# Patient Record
Sex: Female | Born: 1970 | Race: White | Hispanic: No | Marital: Married | Smoking: Former smoker
Health system: Southern US, Community
[De-identification: ages and names within clinical notes are randomized; demographics above are authoritative.]

## PROBLEM LIST (undated history)

## (undated) DIAGNOSIS — G709 Myoneural disorder, unspecified: Secondary | ICD-10-CM

## (undated) DIAGNOSIS — E0500 Thyrotoxicosis with diffuse goiter without thyrotoxic crisis or storm: Secondary | ICD-10-CM

## (undated) DIAGNOSIS — E538 Deficiency of other specified B group vitamins: Secondary | ICD-10-CM

## (undated) DIAGNOSIS — I10 Essential (primary) hypertension: Secondary | ICD-10-CM

## (undated) DIAGNOSIS — G43009 Migraine without aura, not intractable, without status migrainosus: Secondary | ICD-10-CM

## (undated) DIAGNOSIS — Z923 Personal history of irradiation: Secondary | ICD-10-CM

## (undated) DIAGNOSIS — M359 Systemic involvement of connective tissue, unspecified: Secondary | ICD-10-CM

## (undated) DIAGNOSIS — M1990 Unspecified osteoarthritis, unspecified site: Secondary | ICD-10-CM

## (undated) DIAGNOSIS — T7840XA Allergy, unspecified, initial encounter: Secondary | ICD-10-CM

## (undated) DIAGNOSIS — K219 Gastro-esophageal reflux disease without esophagitis: Secondary | ICD-10-CM

## (undated) DIAGNOSIS — R209 Unspecified disturbances of skin sensation: Principal | ICD-10-CM

## (undated) HISTORY — DX: Migraine without aura, not intractable, without status migrainosus: G43.009

## (undated) HISTORY — DX: Gastro-esophageal reflux disease without esophagitis: K21.9

## (undated) HISTORY — DX: Systemic involvement of connective tissue, unspecified: M35.9

## (undated) HISTORY — DX: Unspecified osteoarthritis, unspecified site: M19.90

## (undated) HISTORY — DX: Thyrotoxicosis with diffuse goiter without thyrotoxic crisis or storm: E05.00

## (undated) HISTORY — DX: Allergy, unspecified, initial encounter: T78.40XA

## (undated) HISTORY — DX: Myoneural disorder, unspecified: G70.9

## (undated) HISTORY — DX: Deficiency of other specified B group vitamins: E53.8

## (undated) HISTORY — DX: Unspecified disturbances of skin sensation: R20.9

## (undated) HISTORY — DX: Personal history of irradiation: Z92.3

## (undated) HISTORY — DX: Essential (primary) hypertension: I10

---
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
## 1998-07-30 HISTORY — PX: TUBAL LIGATION: SHX77

## 2013-03-04 ENCOUNTER — Ambulatory Visit (INDEPENDENT_AMBULATORY_CARE_PROVIDER_SITE_OTHER): Payer: BC Managed Care – PPO

## 2013-03-04 ENCOUNTER — Ambulatory Visit (INDEPENDENT_AMBULATORY_CARE_PROVIDER_SITE_OTHER): Payer: BC Managed Care – PPO | Admitting: Neurology

## 2013-03-04 DIAGNOSIS — Z0289 Encounter for other administrative examinations: Secondary | ICD-10-CM

## 2013-03-04 DIAGNOSIS — R209 Unspecified disturbances of skin sensation: Secondary | ICD-10-CM

## 2013-03-04 NOTE — Procedures (Signed)
  HISTORY:  Sandy Brennan is a 42 year old patient with a six-week history of episodes of hot sensations across the lower abdomen, with sensations going into the legs as well with tingling in the feet. The episodes only occur while lying down, not present with up and active. The patient denies any problems with balance, weakness, or problems controlling the bowels or the bladder. The patient is being evaluated for a possible neuropathy or a lumbosacral radiculopathy.  NERVE CONDUCTION STUDIES:  Nerve conduction studies were performed on both lower extremities. The distal motor latencies and motor amplitudes for the peroneal and posterior tibial nerves were within normal limits. The nerve conduction velocities for these nerves were also normal. The H reflex latencies were normal. The sensory latencies for the peroneal nerves were within normal limits.   EMG STUDIES:  EMG study was performed on the right lower extremity:  The tibialis anterior muscle reveals 2 to 4K motor units with full recruitment. No fibrillations or positive waves were seen. The peroneus tertius muscle reveals 2 to 4K motor units with full recruitment. No fibrillations or positive waves were seen. The medial gastrocnemius muscle reveals 1 to 3K motor units with full recruitment. No fibrillations or positive waves were seen. The vastus lateralis muscle reveals 2 to 4K motor units with full recruitment. No fibrillations or positive waves were seen. The iliopsoas muscle reveals 2 to 4K motor units with full recruitment. No fibrillations or positive waves were seen. The biceps femoris muscle (long head) reveals 2 to 4K motor units with full recruitment. No fibrillations or positive waves were seen. The lumbosacral paraspinal muscles were tested at 3 levels, and revealed no abnormalities of insertional activity at the middle and lower levels tested. One plus fibrillations and positive waves were seen at the upper level. There was good  relaxation.  EMG study was performed on the left lower extremity:  The tibialis anterior muscle reveals 2 to 4K motor units with full recruitment. No fibrillations or positive waves were seen. The peroneus tertius muscle reveals 2 to 4K motor units with full recruitment. No fibrillations or positive waves were seen. The medial gastrocnemius muscle reveals 1 to 3K motor units with full recruitment. No fibrillations or positive waves were seen. The vastus lateralis muscle reveals 2 to 4K motor units with full recruitment. No fibrillations or positive waves were seen. The iliopsoas muscle reveals 2 to 4K motor units with full recruitment. No fibrillations or positive waves were seen. The biceps femoris muscle (long head) reveals 2 to 3K motor units with full recruitment. No fibrillations or positive waves were seen. The lumbosacral paraspinal muscles were tested at 3 levels, and revealed no abnormalities of insertional activity at all 3 levels tested. There was good relaxation.   IMPRESSION:  Nerve conduction studies done on both lower extremities were within normal limits. No evidence of a peripheral neuropathy is seen. A small fiber neuropathy may be missed by a standard nerve conduction studies, however. Clinical correlation is required. EMG study of the right lower extremity shows no abnormalities with exception of isolated mild acute denervation in the upper lumbosacral paraspinal muscles. This is of unclear clinical significance, but could represent an upper lumbosacral radiculopathy. Clinical correlation is required. EMG evaluation of the left lower extremity was normal. No evidence of a lumbosacral radiculopathy is seen.  Marlan Palau MD 03/04/2013 10:22 AM  [facility] [address] [place], [place] [address]  Phone [phone] Fax [phone]

## 2013-03-27 ENCOUNTER — Encounter: Payer: Self-pay | Admitting: Neurology

## 2013-03-27 ENCOUNTER — Ambulatory Visit (INDEPENDENT_AMBULATORY_CARE_PROVIDER_SITE_OTHER): Payer: BC Managed Care – PPO | Admitting: Neurology

## 2013-03-27 VITALS — BP 112/77 | HR 92 | Ht 65.0 in | Wt 145.0 lb

## 2013-03-27 DIAGNOSIS — R209 Unspecified disturbances of skin sensation: Secondary | ICD-10-CM

## 2013-03-27 HISTORY — DX: Unspecified disturbances of skin sensation: R20.9

## 2013-03-27 NOTE — Progress Notes (Signed)
Reason for visit: Sensory alteration  Sandy Brennan is a 42 y.o. female  History of present illness:  Sandy Brennan is a 42 year old right-handed white female with a history of onset of sensory alteration that began in the lower abdomen 2 months prior to this evaluation. The patient indicated that the sensory complaints began on 01/25/2013. The day before, she was completely normal. Within 2 weeks, the sensory alterations spread down both legs, and then began spreading up the back. The patient now has sensory alterations involving all 4 extremities. The patient has a burning or hot sensation that is internal and on the skin. The patient indicates that her symptoms virtually disappear when she stands up and walks, but comes on when she sits or lies down. The patient denies any problems controlling the bowels or the bladder. The patient has no weakness of the extremities or difficulty with balance. The patient was found to have a low vitamin B12 level, and she has gone on B12 shots and oral tablets. The patient will have flushing of the face at times, without significant diaphoresis. The patient will have an occasional electric shock sensations from the neck down into the left shoulder. The patient does have a history of occasional headaches on average occurring once a week. The patient is sent to this office for an evaluation. Nerve conduction studies done previously were unremarkable.  Past Medical History  Diagnosis Date  . Disturbance of skin sensation 03/27/2013  . H/O radioactive iodine thyroid ablation   . Vitamin B12 deficiency   . Migraine without aura, without mention of intractable migraine without mention of status migrainosus   . Graves disease     Currently hypothyroid    Past Surgical History  Procedure Laterality Date  . Cesarean section      2 surgeries  . Tonsillectomy    . Tubal ligation Bilateral     Family History  Problem Relation Age of Onset  . Colon cancer Father   .  Cancer Mother     Breast cancer    Social history:  reports that she has quit smoking. She has never used smokeless tobacco. She reports that she does not drink alcohol or use illicit drugs.  Medications:  No current outpatient prescriptions on file prior to visit.   No current facility-administered medications on file prior to visit.      Allergies  Allergen Reactions  . Codeine Swelling  . Erythromycin Swelling  . Penicillins Swelling  . Prednisone Swelling    ROS:  Out of a complete 14 system review of symptoms, the patient complains only of the following symptoms, and all other reviewed systems are negative.  Fatigue Chest pain, swelling in the legs Blurred vision, eye pain Easy bruising Feeling hot, flushing Skin sensitivity Memory loss, numbness, weakness, dizziness  Blood pressure 112/77, pulse 92, height 5\' 5"  (1.651 m), weight 145 lb (65.772 kg).  Physical Exam  General: The patient is alert and cooperative at the time of the examination.  Head: Pupils are equal, round, and reactive to light. Discs are flat bilaterally.  Neck: The neck is supple, no carotid bruits are noted.  Respiratory: The respiratory examination is clear.  Cardiovascular: The cardiovascular examination reveals a regular rate and rhythm, no obvious murmurs or rubs are noted.  Neuromuscular: The patient has excellent range of motion of cervical spine.  Skin: Extremities are without significant edema.  Neurologic Exam  Mental status:  Cranial nerves: Facial symmetry is present. There is good sensation  of the face to pinprick and soft touch bilaterally. The strength of the facial muscles and the muscles to head turning and shoulder shrug are normal bilaterally. Speech is well enunciated, no aphasia or dysarthria is noted. Extraocular movements are full. Visual fields are full.  Motor: The motor testing reveals 5 over 5 strength of all 4 extremities. Good symmetric motor tone is noted  throughout.  Sensory: Sensory testing is intact to pinprick, soft touch, vibration sensation, and position sense on all 4 extremities. No evidence of extinction is noted.  Coordination: Cerebellar testing reveals good finger-nose-finger and heel-to-shin bilaterally.  Gait and station: Gait is normal. Tandem gait is normal. Romberg is negative. No drift is seen.  Reflexes: Deep tendon reflexes are symmetric and normal bilaterally. Toes are downgoing bilaterally.   Assessment/Plan:  1. Sensory alteration, all 4 extremities  The patient will be set up for evaluation to exclude demyelinating disease affecting the spinal cord. The patient will be sent for MRI of the cervical and thoracic spine. If this is unremarkable, the patient will be sent for MRI of the brain. Further blood work may be indicated. The patient followup in 3-4 months.  Marlan Palau MD 03/27/2013 6:52 PM  [facility] [address] [place], [place] [address]  Phone [phone] Fax [phone]

## 2013-04-09 ENCOUNTER — Encounter: Payer: Self-pay | Admitting: Neurology

## 2013-04-09 ENCOUNTER — Ambulatory Visit (INDEPENDENT_AMBULATORY_CARE_PROVIDER_SITE_OTHER): Payer: BC Managed Care – PPO

## 2013-04-09 DIAGNOSIS — R209 Unspecified disturbances of skin sensation: Secondary | ICD-10-CM

## 2013-04-09 MED ORDER — GADOPENTETATE DIMEGLUMINE 469.01 MG/ML IV SOLN: 13.0000 mL | Freq: Once | INTRAVENOUS | Status: AC | PRN

## 2013-04-10 ENCOUNTER — Telehealth: Payer: Self-pay | Admitting: Neurology

## 2013-04-10 DIAGNOSIS — R209 Unspecified disturbances of skin sensation: Secondary | ICD-10-CM

## 2013-04-10 NOTE — Telephone Encounter (Signed)
I called patient. The MRI the cervical spine and thoracic spine were unremarkable, no evidence of compression or demyelinating disease. The patient indicates that the sensory changes have worsened. I will check further blood work. The patient has a history of a low B12 level, but she is on supplementation. I will get an MRI the brain.

## 2013-04-14 ENCOUNTER — Other Ambulatory Visit (INDEPENDENT_AMBULATORY_CARE_PROVIDER_SITE_OTHER): Payer: BC Managed Care – PPO

## 2013-04-14 DIAGNOSIS — R209 Unspecified disturbances of skin sensation: Secondary | ICD-10-CM

## 2013-04-14 DIAGNOSIS — Z0289 Encounter for other administrative examinations: Secondary | ICD-10-CM

## 2013-04-20 ENCOUNTER — Ambulatory Visit: Payer: BC Managed Care – PPO | Admitting: Neurology

## 2013-04-20 LAB — ANGIOTENSIN CONVERTING ENZYME: Angio Convert Enzyme: 20 U/L (ref 14–82)

## 2013-04-20 LAB — LYME, TOTAL AB TEST/REFLEX: Lyme IgG/IgM Ab: <0.91 {ISR} (ref 0.00–0.90)

## 2013-04-20 LAB — FACTOR 5 LEIDEN

## 2013-04-20 LAB — CARDIOLIPIN ANTIBODY
Anticardiolipin IgA: <9 APL U/mL (ref 0–11)
Anticardiolipin IgG: <9 GPL U/mL (ref 0–14)
Anticardiolipin IgM: <9 MPL U/mL (ref 0–12)

## 2013-04-20 LAB — LUPUS ANTICOAGULANT
PTT Lupus Anticoagulant: 38.4 s (ref 0.0–50.0)
dPT: 35.8 s (ref 0.0–55.0)

## 2013-04-20 LAB — SEDIMENTATION RATE: Sed Rate: 2 mm/hr (ref 0–32)

## 2013-04-21 ENCOUNTER — Telehealth: Payer: Self-pay

## 2013-04-21 NOTE — Telephone Encounter (Signed)
I called and spoke with patient. I relayed information from Dr. Anne Hahn regarding patient blood work results. Patient stated, "That's good," thanked me and had no questions.

## 2013-04-21 NOTE — Telephone Encounter (Signed)
Message copied by [facility] on Tue Apr 21, 2013  9:33 AM ------      Message from: Stephanie Acre      Created: Mon Apr 20, 2013  6:09 PM       Please call the patient. The blood work results are unremarkable. Thank you.            ----- Message -----         From: [facility] Lab Results In Interface         Sent: 04/20/2013  11:40 AM           To: York Spaniel, MD                   ------

## 2013-04-22 ENCOUNTER — Ambulatory Visit (INDEPENDENT_AMBULATORY_CARE_PROVIDER_SITE_OTHER): Payer: BC Managed Care – PPO

## 2013-04-22 DIAGNOSIS — R209 Unspecified disturbances of skin sensation: Secondary | ICD-10-CM

## 2013-04-22 MED ORDER — GADOPENTETATE DIMEGLUMINE 469.01 MG/ML IV SOLN: 13.0000 mL | Freq: Once | INTRAVENOUS | Status: AC | PRN

## 2013-04-23 ENCOUNTER — Telehealth: Payer: Self-pay | Admitting: Neurology

## 2013-04-23 DIAGNOSIS — R209 Unspecified disturbances of skin sensation: Secondary | ICD-10-CM

## 2013-04-23 NOTE — Telephone Encounter (Signed)
I called the patient. The patient is still having a lot of sensory symptoms. MRI evaluation the brain, cervical spine, and thoracic spine are all normal. Blood work was unremarkable. We will pursue further blood work, start the patient on Cymbalta. We will check nerve conduction studies, the patient still is having sensory changes on all 4 extremities.

## 2013-04-24 ENCOUNTER — Encounter: Payer: Self-pay | Admitting: Neurology

## 2013-04-27 ENCOUNTER — Other Ambulatory Visit (INDEPENDENT_AMBULATORY_CARE_PROVIDER_SITE_OTHER): Payer: BC Managed Care – PPO

## 2013-04-27 DIAGNOSIS — Z0289 Encounter for other administrative examinations: Secondary | ICD-10-CM

## 2013-04-27 DIAGNOSIS — R209 Unspecified disturbances of skin sensation: Secondary | ICD-10-CM

## 2013-04-29 NOTE — Progress Notes (Signed)
Quick Note:  Pt called with normal lab results. She asked about authorization for EMG. Will ask referral on tomorrow. ______

## 2013-05-06 ENCOUNTER — Telehealth: Payer: Self-pay | Admitting: Neurology

## 2013-05-06 ENCOUNTER — Ambulatory Visit (INDEPENDENT_AMBULATORY_CARE_PROVIDER_SITE_OTHER): Payer: BC Managed Care – PPO

## 2013-05-06 DIAGNOSIS — R209 Unspecified disturbances of skin sensation: Secondary | ICD-10-CM

## 2013-05-06 NOTE — Telephone Encounter (Signed)
I called the patient. The NCV were again normal. Etiology of her sensory complaints is not clear. Most of the sensory alteration is on the body, most notable when she is resting. MRI evaluation of the central nervous system is negative. I will check a 24 urine for heavy metals and for porphyria. We will consider a referral for second opinion at some point.

## 2013-05-06 NOTE — Procedures (Signed)
  HISTORY:  Sandy Brennan is a 41 year old patient with a history of sensory alteration on all 4 extremities. The patient has had symptoms for about 3 months, with a relatively sudden onset. The patient is being evaluated for a possible neuropathy.  NERVE CONDUCTION STUDIES:  Nerve conduction studies were performed on both upper extremities. The distal motor latencies and motor amplitudes for the median and ulnar nerves were within normal limits. The F wave latencies and nerve conduction velocities for these nerves were also normal. The sensory latencies for the median and ulnar nerves were normal.  Nerve conduction studies were performed on both lower extremities. The distal motor latencies and motor amplitudes for the peroneal and posterior tibial nerves were within normal limits. The nerve conduction velocities for these nerves were also normal. The H reflex latencies were normal. The sensory latencies for the peroneal nerves were within normal limits.   EMG STUDIES:  EMG evaluation was not performed.  IMPRESSION:  Nerve conduction studies done on all 4 extremities were unremarkable, without evidence of an underlying peripheral neuropathy.  Marlan Palau MD 05/06/2013 11:30 AM  [facility] [address] [place], [place] [address]  Phone [phone] Fax [phone]

## 2013-05-11 ENCOUNTER — Other Ambulatory Visit (INDEPENDENT_AMBULATORY_CARE_PROVIDER_SITE_OTHER): Payer: BC Managed Care – PPO

## 2013-05-11 ENCOUNTER — Other Ambulatory Visit: Payer: Self-pay | Admitting: Neurology

## 2013-05-11 ENCOUNTER — Encounter: Payer: Self-pay | Admitting: Neurology

## 2013-05-11 DIAGNOSIS — Z0289 Encounter for other administrative examinations: Secondary | ICD-10-CM

## 2013-05-12 ENCOUNTER — Telehealth: Payer: Self-pay | Admitting: Neurology

## 2013-05-12 NOTE — Telephone Encounter (Signed)
Patient left message on my chart if it was OK to bring her urine sample to the [facility] in [place] on [address].  I called the lab in [place] and they said that the patient could drop off the sample with them, they just needed the order.  I left a message for patient and told her that when she drops off the sample to have them call the office and we can fax the order.

## 2013-05-13 ENCOUNTER — Other Ambulatory Visit: Payer: Self-pay | Admitting: Neurology

## 2013-05-19 ENCOUNTER — Other Ambulatory Visit: Payer: Self-pay | Admitting: Neurology

## 2013-05-20 LAB — ALA DELTA, 24-HOUR URINE
Delta Ala, 24H Ur: 1.1 mg/L
Urine Volume: 2.2 mg/24 hr (ref 0.5–5.1)

## 2013-05-21 NOTE — Progress Notes (Signed)
Quick Note:  Unremarkable findings confirmed with patient, requesting a copy mailed ______

## 2013-06-01 ENCOUNTER — Encounter: Payer: Self-pay | Admitting: Neurology

## 2013-06-01 NOTE — Telephone Encounter (Signed)
Checked with Arlene, in [facility] and did get results.  Placed in Dr. Clarisa Kindred in box.

## 2013-06-04 ENCOUNTER — Other Ambulatory Visit: Payer: Self-pay

## 2013-06-18 ENCOUNTER — Encounter: Payer: Self-pay | Admitting: Neurology

## 2013-07-12 ENCOUNTER — Encounter: Payer: Self-pay | Admitting: Emergency Medicine

## 2013-07-12 ENCOUNTER — Emergency Department
Admission: EM | Admit: 2013-07-12 | Discharge: 2013-07-12 | Disposition: A | Discharge status: Home / Self Care | Payer: BC Managed Care – PPO | Source: Home / Self Care | Attending: Family Medicine | Admitting: Family Medicine

## 2013-07-12 ENCOUNTER — Emergency Department (INDEPENDENT_AMBULATORY_CARE_PROVIDER_SITE_OTHER): Payer: BC Managed Care – PPO

## 2013-07-12 DIAGNOSIS — R079 Chest pain, unspecified: Secondary | ICD-10-CM

## 2013-07-12 DIAGNOSIS — S20211A Contusion of right front wall of thorax, initial encounter: Secondary | ICD-10-CM

## 2013-07-12 DIAGNOSIS — M25569 Pain in unspecified knee: Secondary | ICD-10-CM

## 2013-07-12 DIAGNOSIS — S8002XA Contusion of left knee, initial encounter: Secondary | ICD-10-CM

## 2013-07-12 DIAGNOSIS — S20219A Contusion of unspecified front wall of thorax, initial encounter: Secondary | ICD-10-CM

## 2013-07-12 MED ORDER — MELOXICAM 15 MG PO TABS: 15.0000 mg | ORAL_TABLET | Freq: Every day | ORAL | Status: DC

## 2013-07-12 NOTE — ED Provider Notes (Signed)
CSN: [id number]     Arrival date & time 07/12/13  1632 History   First MD Initiated Contact with Patient 07/12/13 1654     Chief Complaint  Patient presents with  . Chest Injury    fall, with right rib pain  . Knee Pain    HPI  Pt presents with today with chief complaint of fall.  Pt accidentally fell at home striking R rib on cabinet and fell on L knee Has had R sided rib pain and L knee pain  No SOB L knee swelling Able to ambulate on L knee.     Past Medical History  Diagnosis Date  . Disturbance of skin sensation 03/27/2013  . H/O radioactive iodine thyroid ablation   . Vitamin B12 deficiency   . Migraine without aura, without mention of intractable migraine without mention of status migrainosus   . Graves disease     Currently hypothyroid   Past Surgical History  Procedure Laterality Date  . Cesarean section      2 surgeries  . Tonsillectomy    . Tubal ligation Bilateral    Family History  Problem Relation Age of Onset  . Colon cancer Father   . Cancer Mother     Breast cancer   History  Substance Use Topics  . Smoking status: Former Games developer  . Smokeless tobacco: Never Used  . Alcohol Use: No   OB History   Grav Para Term Preterm Abortions TAB SAB Ect Mult Living                 Review of Systems  All other systems reviewed and are negative.    Allergies  Codeine; Erythromycin; Penicillins; and Prednisone  Home Medications   Current Outpatient Rx  Name  Route  Sig  Dispense  Refill  . cyanocobalamin (,VITAMIN B-12,) 1000 MCG/ML injection   Intramuscular   Inject 1,000 mcg into the muscle every 30 (thirty) days.         . fluocinonide (LIDEX) 0.05 % external solution   Topical   Apply 0.05 application topically as needed.         . gabapentin (NEURONTIN) 100 MG capsule   Oral   Take 100 mg by mouth daily.         . Magnesium 250 MG TABS   Oral   Take 250 mg by mouth daily.         . meloxicam (MOBIC) 15 MG tablet   Oral  Take 1 tablet (15 mg total) by mouth daily.   30 tablet   1   . Multiple Vitamin (MULTIVITAMIN) tablet   Oral   Take 1 tablet by mouth daily.         . Potassium 99 MG TABS   Oral   Take 99 mg by mouth daily.         Marland Kitchen spironolactone (ALDACTONE) 25 MG tablet   Oral   Take 25 mg by mouth daily.         Marland Kitchen SYNTHROID 125 MCG tablet   Oral   Take 125 mcg by mouth daily.          BP 120/81  Pulse 81  Temp(Src) 97.7 F (36.5 C) (Oral)  Ht 5\' 6"  (1.676 m)  Wt 145 lb (65.772 kg)  BMI 23.41 kg/m2  SpO2 100%  LMP 07/06/2013 Physical Exam  Constitutional: She is oriented to person, place, and time. She appears well-developed and well-nourished.  HENT:  Head: Normocephalic and  atraumatic.  Eyes: Conjunctivae are normal. Pupils are equal, round, and reactive to light.  Neck: Normal range of motion.  Cardiovascular: Normal rate and regular rhythm.   Pulmonary/Chest: Effort normal and breath sounds normal.    Musculoskeletal: Normal range of motion.       Legs: Neurological: She is alert and oriented to person, place, and time.  Skin: Skin is warm.    ED Course  Procedures (including critical care time) Labs Review Labs Reviewed - No data to display Imaging Review Dg Ribs Unilateral W/chest Right  07/12/2013   CLINICAL DATA:  Fall, right anterior rib pain  EXAM: RIGHT RIBS AND CHEST - 3+ VIEW  COMPARISON:  None.  FINDINGS: Lungs are essentially clear. No focal consolidation. No pleural effusion or pneumothorax.  The heart is normal in size.  No displaced right rib fracture is seen.  IMPRESSION: No evidence of acute cardiopulmonary disease.  No displaced right rib fracture is seen.   Electronically Signed   By: Charline Bills M.D.   On: 07/12/2013 17:30   Dg Knee Complete 4 Views Left  07/12/2013   CLINICAL DATA:  Left knee pain, fell in bathtub 1 hr ago  EXAM: LEFT KNEE - COMPLETE 4+ VIEW  COMPARISON:  None  FINDINGS: Osseous mineralization normal.  Mild medial  compartment joint space narrowing.  Significant anterior soft tissue swelling at left knee and proximal left lower leg.  No acute fracture, dislocation or bone destruction.  No knee joint effusion.  IMPRESSION: No acute osseous abnormalities.  Minimal degenerative changes left knee.   Electronically Signed   By: Ulyses Southward M.D.   On: 07/12/2013 17:36    EKG Interpretation    Date/Time:    Ventricular Rate:    PR Interval:    QRS Duration:   QT Interval:    QTC Calculation:   R Axis:     Text Interpretation:              MDM   1. Rib contusion, right, initial encounter   2. Knee contusion, left, initial encounter    Xrays negative for any fracture or derangement.  Likley secondary soft tissue injury  RICE and NSAIDs.  L knee brace Plan for follow up with sports medicine for L knee.  Follow up as needed.     The patient and/or caregiver has been counseled thoroughly with regard to treatment plan and/or medications prescribed including dosage, schedule, interactions, rationale for use, and possible side effects and they verbalize understanding. Diagnoses and expected course of recovery discussed and will return if not improved as expected or if the condition worsens. Patient and/or caregiver verbalized understanding.         Doree Albee, MD 07/12/13 [phone]

## 2013-07-12 NOTE — ED Notes (Signed)
Larey Seat today noted with right rib pain and left knee pain;

## 2013-10-09 ENCOUNTER — Encounter: Payer: Self-pay | Admitting: Neurology

## 2013-10-16 ENCOUNTER — Ambulatory Visit: Payer: BC Managed Care – PPO | Admitting: Neurology

## 2014-01-11 ENCOUNTER — Other Ambulatory Visit (HOSPITAL_BASED_OUTPATIENT_CLINIC_OR_DEPARTMENT_OTHER): Payer: Self-pay | Admitting: Family Medicine

## 2014-01-11 DIAGNOSIS — R232 Flushing: Secondary | ICD-10-CM

## 2014-01-13 ENCOUNTER — Encounter (HOSPITAL_BASED_OUTPATIENT_CLINIC_OR_DEPARTMENT_OTHER): Payer: Self-pay

## 2014-01-13 ENCOUNTER — Ambulatory Visit (HOSPITAL_BASED_OUTPATIENT_CLINIC_OR_DEPARTMENT_OTHER)
Admission: RE | Admit: 2014-01-13 | Discharge: 2014-01-13 | Disposition: A | Discharge status: Home / Self Care | Payer: BC Managed Care – PPO | Source: Ambulatory Visit | Attending: Family Medicine | Admitting: Family Medicine

## 2014-01-13 DIAGNOSIS — I10 Essential (primary) hypertension: Secondary | ICD-10-CM | POA: Insufficient documentation

## 2014-01-13 DIAGNOSIS — R232 Flushing: Secondary | ICD-10-CM

## 2014-01-13 DIAGNOSIS — E0500 Thyrotoxicosis with diffuse goiter without thyrotoxic crisis or storm: Secondary | ICD-10-CM | POA: Insufficient documentation

## 2014-01-13 MED ORDER — IOHEXOL 300 MG/ML  SOLN
100.0000 mL | Freq: Once | INTRAMUSCULAR | Status: AC | PRN
  Administered 2014-01-13: 100 mL via INTRAVENOUS

## 2014-01-17 ENCOUNTER — Encounter: Payer: Self-pay | Admitting: Neurology

## 2014-01-28 LAB — PORPHYRINS, QN, 24 HR UR
Copropor(CP)III,24hr: 31 ug/24 hr (ref 0–74)
Coproporph(CP)I,24hr: 26 ug/24 hr — ABNORMAL HIGH (ref 0–24)
Coproporphyrin (CP) I: 10 ug/L
Coproporphyrin (CP) III: 12 ug/L
Heptacab(7-CP),24hr: 3 ug/24 hr (ref 0–4)
Heptacarboxyl (7-CP): 1 ug/L
Hexacarb(6-CP),24hr: <3 ug/24 hr — ABNORMAL HIGH (ref 0–1)
Hexacarboxyl (6-CP): <1 ug/L
Pentacarb(5-CP),24hr: <3 ug/24 hr (ref 0–4)
Pentacarboxyl (5-CP): <1 ug/L
Uroporph(UP),24hr: 23 ug/24 hr (ref 0–24)
Uroporphyrins (UP): 9 ug/L

## 2014-01-28 LAB — HEAVY METALS SCREEN, URINE
Arsenic Ur: NOT DETECTED ug/L (ref 0–50)
Arsenic(Inorganic),U: NOT DETECTED ug/L (ref 0–19)
Creatinine(Crt),U: 0.41 g/L (ref 0.30–3.00)
Lead, Rand Ur: NOT DETECTED ug/L (ref 0–49)
Mercury, Ur: NOT DETECTED ug/L (ref 0–19)

## 2014-07-24 IMAGING — CT CT ABD-PELV W/ CM
2 of 5 series · 16 of 46 positions shown, 18 images · IV contrast (APPLIED)
Comparison: Abdominal ultrasound, 06/10/2013

CLINICAL DATA: Hypertension. Flushing. Prior thyroid ablation and
Graves disease.

EXAM:
CT ABDOMEN AND PELVIS WITH CONTRAST
TECHNIQUE: Multidetector CT imaging of the abdomen and pelvis was performed
using the standard protocol following bolus administration of
intravenous contrast.
CONTRAST:  100mL OMNIPAQUE IOHEXOL 300 MG/ML  SOLN

[Series 2: abd/pelvis 5.0 b31f · axial · 0.60mm/px · z∈[-494,-124]mm · 13 of 84 slices shown, 15 images]
[im 5/84  soft-tissue]
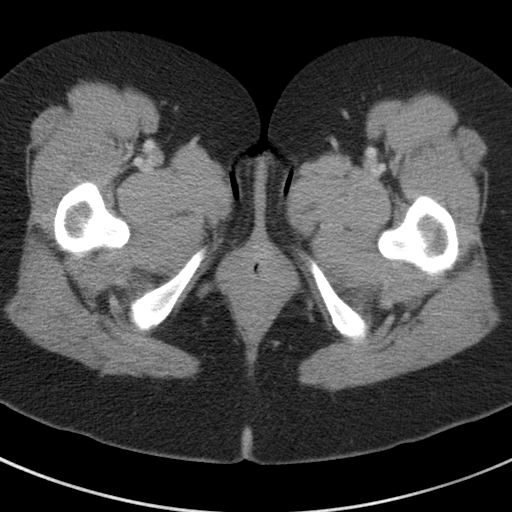
[im 5/84  bone]
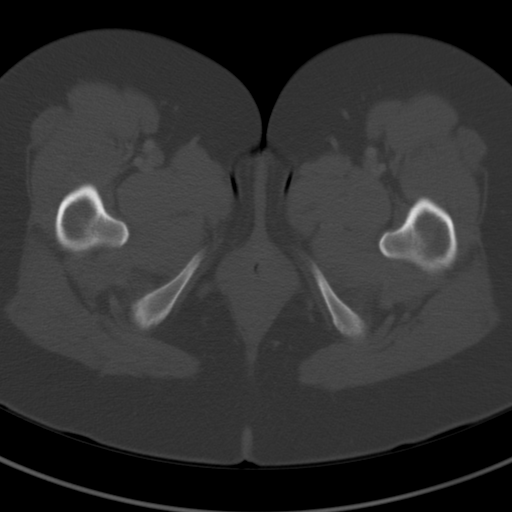
[im 14/84  soft-tissue]
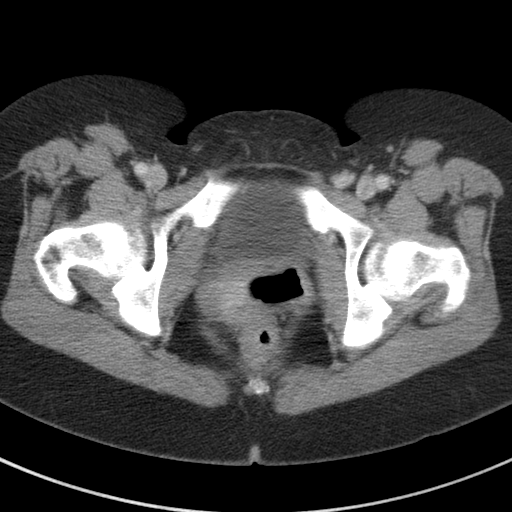
[im 18/84  soft-tissue]
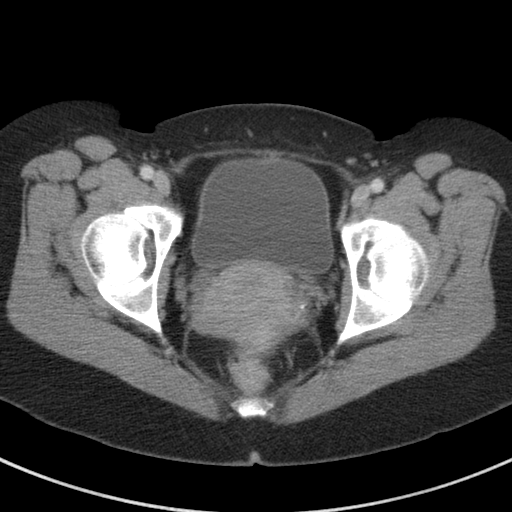
[im 22/84  soft-tissue]
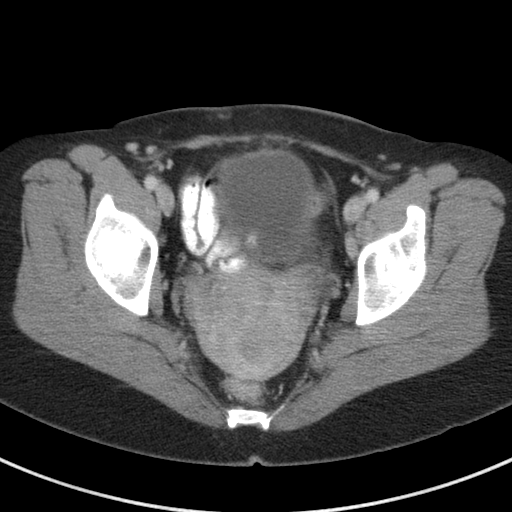
[im 31/84  soft-tissue]
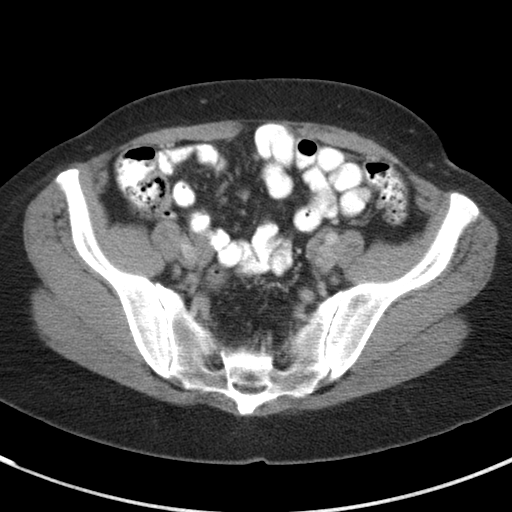
[im 35/84  soft-tissue]
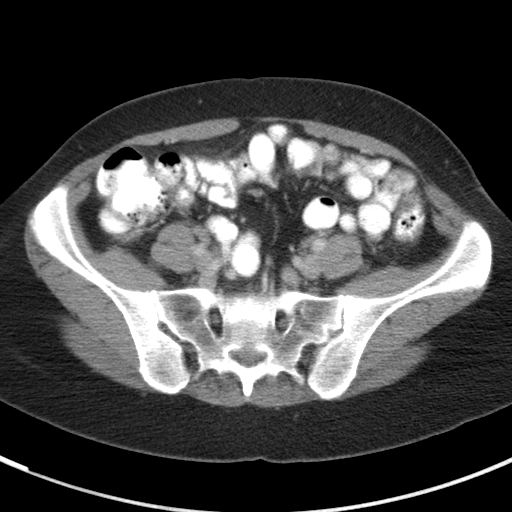
[im 44/84  soft-tissue]
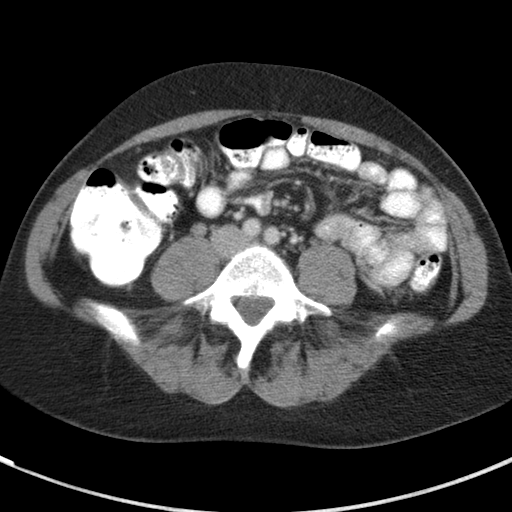
[im 49/84  soft-tissue]
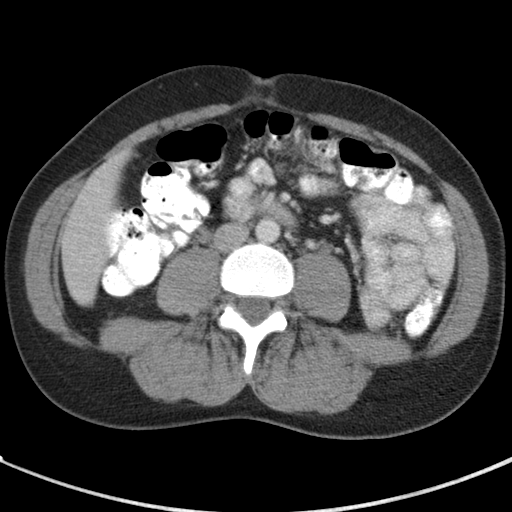
[im 53/84  soft-tissue]
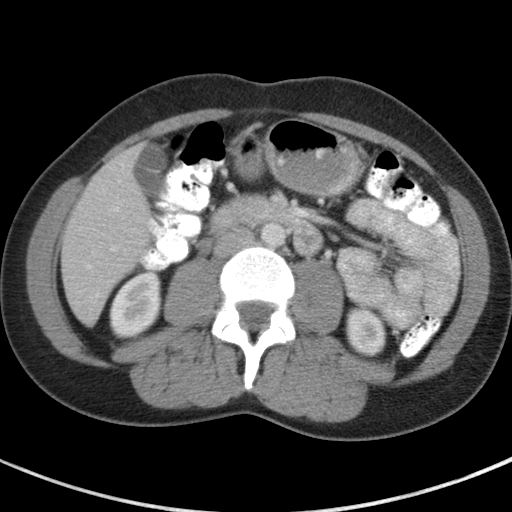
[im 53/84  bone]
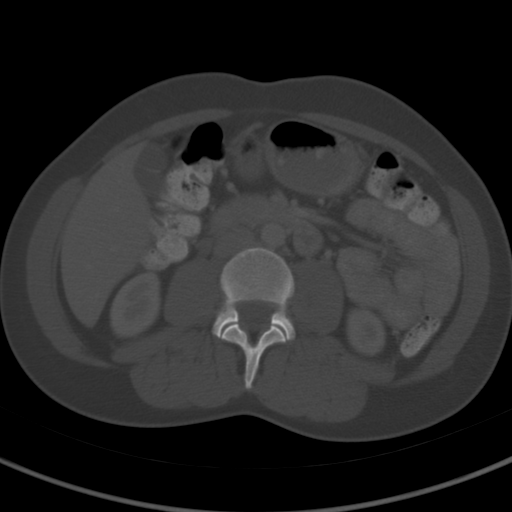
[im 62/84  soft-tissue]
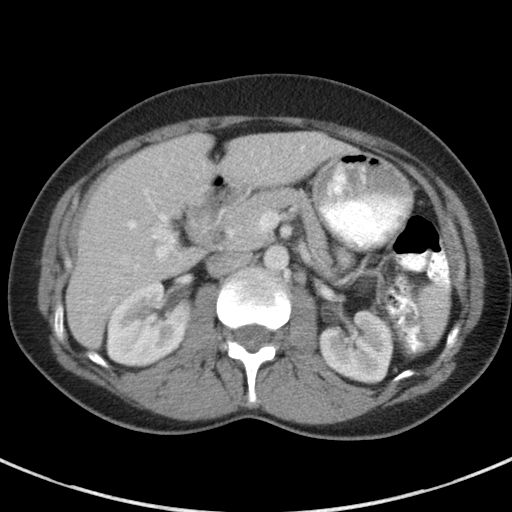
[im 66/84  soft-tissue]
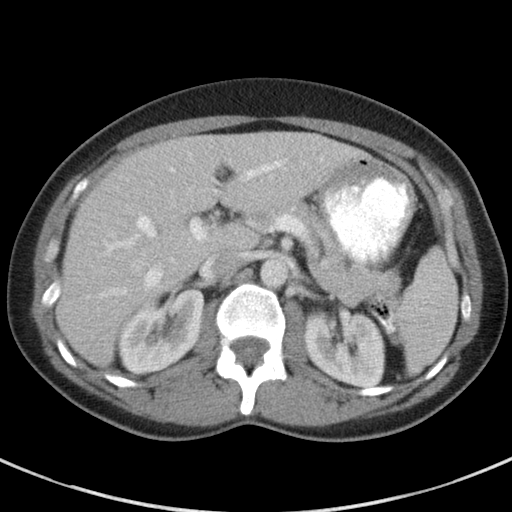
[im 70/84  soft-tissue]
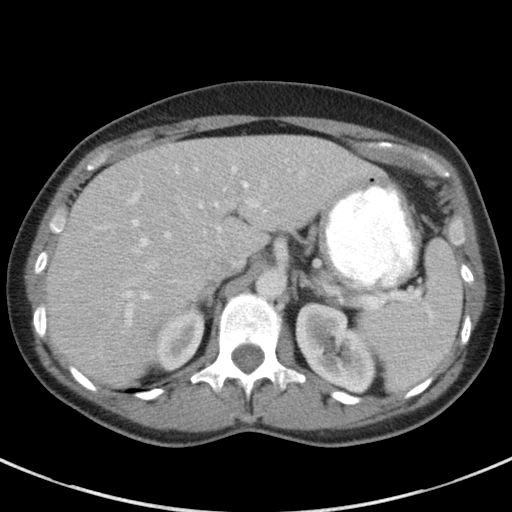
[im 79/84  soft-tissue]
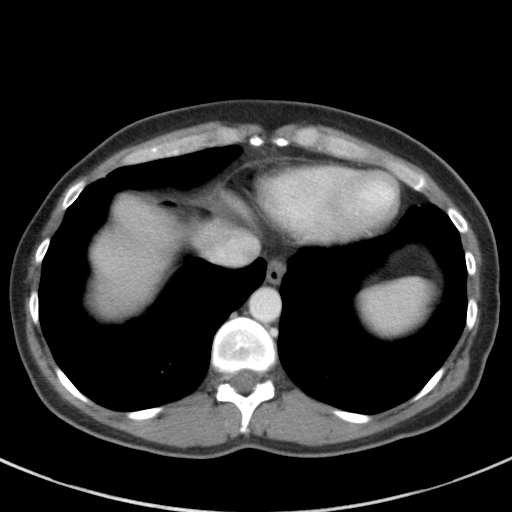

[Series 5: abd/pelvis 3.0 coronal · coronal · 0.63mm/px · 3 of 76 slices shown]
[im 26/76  soft-tissue]
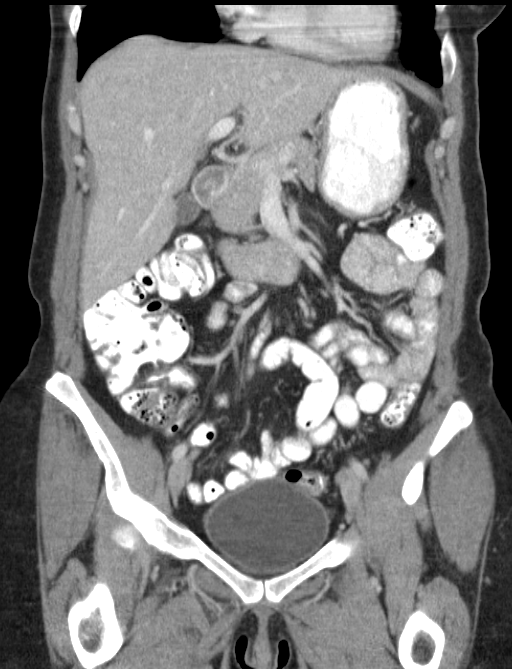
[im 34/76  soft-tissue]
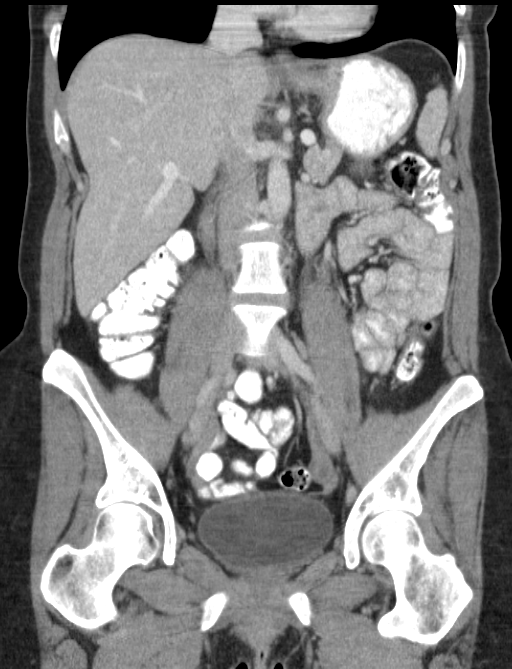
[im 42/76  soft-tissue]
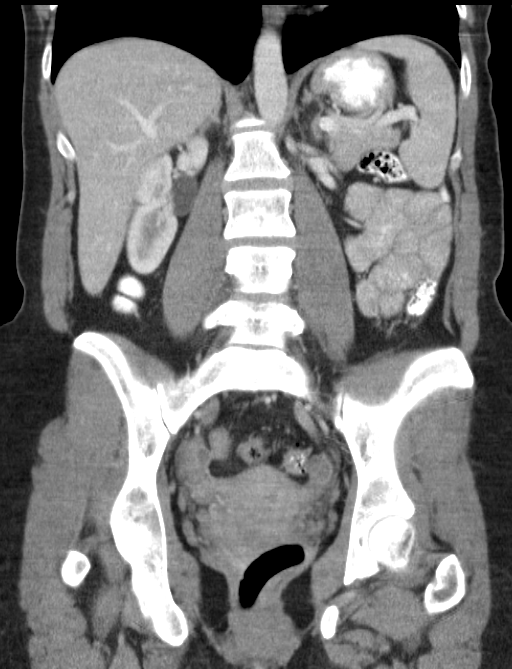

[16 of 46 positions shown; findings below may reference images not displayed]

FINDINGS: Scar or subsegmental atelectasis, right middle lobe. The liver,
spleen, pancreas, and gallbladder appear normal. No adrenal mass.
Normal morphology of the adrenal glands observed.

Suspected 1 mm left kidney upper pole nonobstructive calculus, image
16 of series 2. Kidneys otherwise unremarkable. Ureters and urinary
bladder appear unremarkable.

No pathologic retroperitoneal adenopathy. No retroperitoneal mass
observed. No pathologic pelvic adenopathy is observed.

Small umbilical hernia contains adipose tissue. Appendix normal.
Uterine and adnexal contours unremarkable. Incidentally, the uterus
appears retroverted.
IMPRESSION: 1. No adrenal mass or other mass in the abdomen/pelvis identified.
2. I suspect a 1 mm left kidney upper pole nonobstructive calculus.

## 2015-07-19 ENCOUNTER — Ambulatory Visit: Payer: Self-pay | Admitting: Interventional Cardiology

## 2015-08-03 ENCOUNTER — Other Ambulatory Visit: Payer: Self-pay | Admitting: Cardiology

## 2015-08-03 ENCOUNTER — Encounter: Payer: Self-pay | Admitting: Cardiology

## 2015-08-03 ENCOUNTER — Ambulatory Visit (INDEPENDENT_AMBULATORY_CARE_PROVIDER_SITE_OTHER): Payer: BLUE CROSS/BLUE SHIELD | Admitting: Cardiology

## 2015-08-03 VITALS — BP 120/80 | HR 86 | Ht 66.0 in | Wt 163.0 lb

## 2015-08-03 DIAGNOSIS — R079 Chest pain, unspecified: Secondary | ICD-10-CM | POA: Diagnosis not present

## 2015-08-03 DIAGNOSIS — R002 Palpitations: Secondary | ICD-10-CM | POA: Diagnosis not present

## 2015-08-03 DIAGNOSIS — R0789 Other chest pain: Secondary | ICD-10-CM

## 2015-08-03 DIAGNOSIS — R601 Generalized edema: Secondary | ICD-10-CM

## 2015-08-03 DIAGNOSIS — R609 Edema, unspecified: Secondary | ICD-10-CM | POA: Diagnosis not present

## 2015-08-03 DIAGNOSIS — R600 Localized edema: Secondary | ICD-10-CM

## 2015-08-03 DIAGNOSIS — R209 Unspecified disturbances of skin sensation: Secondary | ICD-10-CM

## 2015-08-03 NOTE — Progress Notes (Signed)
Cardiology Office Note   Date:  08/04/2015   ID:  Sandy Brennan, DOB 08-11-1970, MRN [id number]  PCP:  Martin[place], BETTY G, MD    Chief Complaint  Patient presents with  . Edema  . Palpitations      History of Present Illness: Sandy Brennan is a 45 y.o. female who presents for evaluation of a myriad of symptoms.  She has a history of hypothyroidism.  Her main complaint is a burning sensation that was initially in her feet and lower abdomen and then spreading into her entire body.  Initially it was only when she would sit down and would describe it as a boiing sensation in her abdomen as though she was cooking from the inside out.  This started back in 2014 and has had an extensive workup by her PCP all of which is negative.  She developed chemical sensitivities since moving to [place] and though it was her mattress.  SHe says that when she sleeps on her bed it discolors her bed sheets when she touches them and turns it black, SHe was referred to the Undiagnosed disease team at [facility] which did not accept her.  She denies any chest pain or pressure but complains of a burning hot sensation from her neck to her lower abdomen.  SHe denies any SOB, DOE, but does complain of LE edema and some palpitations.      Past Medical History  Diagnosis Date  . Disturbance of skin sensation 03/27/2013  . H/O radioactive iodine thyroid ablation   . Vitamin B12 deficiency   . Migraine without aura, without mention of intractable migraine without mention of status migrainosus   . Graves disease     Currently hypothyroid    Past Surgical History  Procedure Laterality Date  . Cesarean section      2 surgeries  . Tonsillectomy    . Tubal ligation Bilateral      Current Outpatient Prescriptions  Medication Sig Dispense Refill  . cyanocobalamin (,VITAMIN B-12,) 1000 MCG/ML injection Inject 1,000 mcg into the muscle every 30 (thirty) days.    . fluocinonide (LIDEX) 0.05 % external solution Apply  4.23 application topically as needed.    . Magnesium 250 MG TABS Take 250 mg by mouth daily.    . meloxicam (MOBIC) 15 MG tablet Take 1 tablet (15 mg total) by mouth daily. 30 tablet 1  . Multiple Vitamin (MULTIVITAMIN) tablet Take 1 tablet by mouth daily.    . Potassium 99 MG TABS Take 99 mg by mouth daily.    Marland Kitchen spironolactone (ALDACTONE) 25 MG tablet Take 25 mg by mouth daily.    Marland Kitchen SYNTHROID 125 MCG tablet Take 125 mcg by mouth daily.     No current facility-administered medications for this visit.    Allergies:   Codeine; Erythromycin; Penicillins; and Prednisone    Social History:  The patient  reports that she has quit smoking. She has never used smokeless tobacco. She reports that she does not drink alcohol or use illicit drugs.   Family History:  The patient's family history includes Cancer in her mother; Colon cancer in her father.    ROS:  Please see the history of present illness.   Otherwise, review of systems are positive for none.   All other systems are reviewed and negative.    PHYSICAL EXAM: VS:  BP 120/80 mmHg  Pulse 86  Ht 5'  6" (1.676 m)  Wt 163 lb (73.936 kg)  BMI 26.32 kg/m2 , BMI Body mass index is 26.32 kg/(m^2). GEN: Well nourished, well developed, in no acute distress HEENT: normal Neck: no JVD, carotid bruits, or masses Cardiac: RRR; no murmurs, rubs, or gallops,no edema  Respiratory:  clear to auscultation bilaterally, normal work of breathing GI: soft, nontender, nondistended, + BS MS: no deformity or atrophy Skin: warm and dry, no rash Neuro:  Strength and sensation are intact Psych: euthymic mood, full affect   EKG:  EKG is ordered today. The ekg ordered today demonstrates NSR with LAD   Recent Labs: No results found for requested labs within last 365 days.    Lipid Panel No results found for: CHOL, TRIG, HDL, CHOLHDL, VLDL, LDLCALC, LDLDIRECT    Wt Readings from Last 3 Encounters:  08/03/15 163 lb (73.936 kg)  07/12/13 145 lb  (65.772 kg)  03/27/13 145 lb (65.772 kg)       ASSESSMENT AND PLAN:  1.  LE edema - I do not see any on exam today 2.  Vague chest and abdomninal complaints of severe burning from her neck to her pelvis.  This does not sound cardiac in nature but her PCP mentioned to her that she may not be getting enough blood flow.  I will get an echo to make sure there is no structural heart disease  Her EKG is normal and symptoms are not consistent with coronary ischemia so will no pursue ETT at this time.  I will also get carotid dopplers at her request since she says her PCP mentioned that she might have a problem with her carotid arteries. 3.  Palpitations - these are vague - I will get a 30day heart monitor to assess  Current medicines are reviewed at length with the patient today.  The patient does not have concerns regarding medicines.  The following changes have been made:  no change  Labs/ tests ordered today: See above Assessment and Plan  Orders Placed This Encounter  Procedures  . Cardiac event monitor  . EKG 12-Lead  . ECHOCARDIOGRAM COMPLETE     Disposition:   FU with me PRN   Signed, Sueanne Margarita, MD  08/04/2015 7:08 AM    [facility] [address], [place], [place]  [address] Phone: [phone]; Fax: [phone]

## 2015-08-03 NOTE — Patient Instructions (Signed)
Medication Instructions:  Your physician recommends that you continue on your current medications as directed. Please refer to the Current Medication list given to you today.   Labwork: None  Testing/Procedures: Your physician has requested that you have an echocardiogram. Echocardiography is a painless test that uses sound waves to create images of your heart. It provides your doctor with information about the size and shape of your heart and how well your heart's chambers and valves are working. This procedure takes approximately one hour. There are no restrictions for this procedure.   Your physician has requested that you have a carotid duplex. This test is an ultrasound of the carotid arteries in your neck. It looks at blood flow through these arteries that supply the brain with blood. Allow one hour for this exam. There are no restrictions or special instructions.   Your physician has recommended that you wear an event monitor. Event monitors are medical devices that record the heart's electrical activity. Doctors most often us these monitors to diagnose arrhythmias. Arrhythmias are problems with the speed or rhythm of the heartbeat. The monitor is a small, portable device. You can wear one while you do your normal daily activities. This is usually used to diagnose what is causing palpitations/syncope (passing out).  Follow-Up: Your physician recommends that you schedule a follow-up appointment AS NEEDED with Dr. Mayford Knifeurner pending study results.  Any Other Special Instructions Will Be Listed Below (If Applicable).     If you need a refill on your cardiac medications before your next appointment, please call your pharmacy.

## 2015-08-04 DIAGNOSIS — R600 Localized edema: Secondary | ICD-10-CM | POA: Insufficient documentation

## 2015-08-04 DIAGNOSIS — R002 Palpitations: Secondary | ICD-10-CM | POA: Insufficient documentation

## 2015-08-04 DIAGNOSIS — R0789 Other chest pain: Secondary | ICD-10-CM | POA: Insufficient documentation

## 2015-08-04 DIAGNOSIS — R079 Chest pain, unspecified: Secondary | ICD-10-CM | POA: Insufficient documentation

## 2015-08-09 NOTE — Addendum Note (Signed)
Addended by: Armanda MagicURNER, Andrina Locken R on: 08/09/2015 10:02 PM   Modules accepted: Level of Service

## 2015-08-16 ENCOUNTER — Other Ambulatory Visit: Payer: Self-pay

## 2015-08-16 ENCOUNTER — Ambulatory Visit (HOSPITAL_COMMUNITY)
Admission: RE | Admit: 2015-08-16 | Discharge: 2015-08-16 | Disposition: A | Discharge status: Home / Self Care | Payer: BLUE CROSS/BLUE SHIELD | Source: Ambulatory Visit | Attending: Cardiology | Admitting: Cardiology

## 2015-08-16 ENCOUNTER — Ambulatory Visit (HOSPITAL_BASED_OUTPATIENT_CLINIC_OR_DEPARTMENT_OTHER): Payer: BLUE CROSS/BLUE SHIELD

## 2015-08-16 ENCOUNTER — Ambulatory Visit (INDEPENDENT_AMBULATORY_CARE_PROVIDER_SITE_OTHER): Payer: BLUE CROSS/BLUE SHIELD

## 2015-08-16 ENCOUNTER — Encounter (INDEPENDENT_AMBULATORY_CARE_PROVIDER_SITE_OTHER): Payer: Self-pay

## 2015-08-16 DIAGNOSIS — R209 Unspecified disturbances of skin sensation: Secondary | ICD-10-CM | POA: Insufficient documentation

## 2015-08-16 DIAGNOSIS — R079 Chest pain, unspecified: Secondary | ICD-10-CM | POA: Insufficient documentation

## 2015-08-16 DIAGNOSIS — R601 Generalized edema: Secondary | ICD-10-CM | POA: Diagnosis not present

## 2015-08-16 DIAGNOSIS — R002 Palpitations: Secondary | ICD-10-CM

## 2015-08-16 DIAGNOSIS — Z87891 Personal history of nicotine dependence: Secondary | ICD-10-CM | POA: Insufficient documentation

## 2016-04-18 ENCOUNTER — Ambulatory Visit (INDEPENDENT_AMBULATORY_CARE_PROVIDER_SITE_OTHER): Payer: BLUE CROSS/BLUE SHIELD | Admitting: Neurology

## 2016-04-18 ENCOUNTER — Telehealth: Payer: Self-pay | Admitting: Neurology

## 2016-04-18 ENCOUNTER — Encounter: Payer: Self-pay | Admitting: Neurology

## 2016-04-18 VITALS — BP 139/79 | HR 75 | Ht 66.0 in | Wt 140.8 lb

## 2016-04-18 DIAGNOSIS — R209 Unspecified disturbances of skin sensation: Secondary | ICD-10-CM | POA: Diagnosis not present

## 2016-04-18 MED ORDER — CARBAMAZEPINE 200 MG PO TABS: ORAL_TABLET | ORAL | 3 refills | Status: DC

## 2016-04-18 NOTE — Patient Instructions (Signed)
    Tegretol (carbamazepine) may result in dizziness, gait instability, cognitive slowing, or drowsiness. Sometimes, and allergic rash may occur, or a photosensitive rash may occur. If any significant side effects are noted, please contact our office.   

## 2016-04-18 NOTE — Progress Notes (Signed)
Reason for visit: Paresthesia  Referring physician: Dr. Sheran Fava is a 45 y.o. female  History of present illness:  Sandy Brennan is a 45 year old right-handed white female with a history of paresthesias that affected the entire body that began overnight on 01/25/2013. The patient has been seen through this office 3 years ago, she underwent MRI evaluation of the brain, cervical spine, and thoracic spine, she had EMG and nerve conduction study evaluation, and extensive blood work. The entire evaluation was unremarkable. The patient has been seen at [facility] through neurology, no diagnosis was offered at that time. She does have a history of vitamin B12 deficiency, she remains on supplementation. She indicates that her sensory symptoms have worsened over time. Currently, she has burning pain from the breast level down to the mid thigh level that is present with sitting and goes away when she stands up. She has burning sensations at night while sleeping. She has achy sensations in the legs, and electric shock sensations from the head level all the way down to the arms and legs. She has swelling of the feet at times, she may have hot sensations on one hand and cold sensations on the other. She may have blanching of one hand and redness of the other. She has dizziness, she feels somewhat forgetful. She has some issues with constipation, she may have urinary incontinence. She reports some mild gait instability, but no falls. She is not sleeping well at night, she may nap for 10 or 15 minutes at a time, and may get a total of only 2 hours of sleep a night. She has been tried on Lyrica, Cymbalta, and gabapentin without benefit. She currently is taking ibuprofen without much benefit. She is sent back to this office for an evaluation.  Past Medical History:  Diagnosis Date  . Disturbance of skin sensation 03/27/2013  . Graves disease    Currently hypothyroid  . H/O radioactive iodine thyroid ablation     . Migraine without aura, without mention of intractable migraine without mention of status migrainosus   . Vitamin B12 deficiency     Past Surgical History:  Procedure Laterality Date  . CESAREAN SECTION  1996, 2000   2 surgeries  . TONSILLECTOMY  1977  . TUBAL LIGATION Bilateral 2000    Family History  Problem Relation Age of Onset  . Colon cancer Father   . Cancer Mother     Breast cancer  . AAA (abdominal aortic aneurysm) Mother   . Diabetes Maternal Grandmother   . Ovarian cancer Maternal Grandmother   . Lung cancer Paternal Grandmother   . Lung cancer Paternal Grandfather     Social history:  reports that she quit smoking about 22 years ago. She has never used smokeless tobacco. She reports that she drinks about 4.2 oz of alcohol per week . She reports that she does not use drugs.  Medications:  Prior to Admission medications   Medication Sig Start Date End Date Taking? Authorizing Provider  cyanocobalamin (,VITAMIN B-12,) 1000 MCG/ML injection Inject 1,000 mcg into the muscle every 30 (thirty) days.   Yes Historical Provider, MD  fluocinonide (LIDEX) 0.05 % external solution Apply 0.05 application topically as needed. 01/15/13  Yes Historical Provider, MD  Magnesium 250 MG TABS Take 250 mg by mouth daily.   Yes Historical Provider, MD  meloxicam (MOBIC) 15 MG tablet Take 1 tablet (15 mg total) by mouth daily. 07/12/13  Yes Floydene Flock, MD  Multiple Vitamin (  MULTIVITAMIN) tablet Take 1 tablet by mouth daily.   Yes Historical Provider, MD  Potassium 99 MG TABS Take 99 mg by mouth daily.   Yes Historical Provider, MD  SYNTHROID 125 MCG tablet Take 125 mcg by mouth daily. 02/07/13  Yes Historical Provider, MD  triamterene-hydrochlorothiazide (DYAZIDE) 37.5-25 MG capsule  04/09/16  Yes Historical Provider, MD      Allergies  Allergen Reactions  . Codeine Swelling  . Erythromycin Swelling  . Penicillins Swelling  . Prednisone Swelling    ROS:  Out of a complete 14  system review of symptoms, the patient complains only of the following symptoms, and all other reviewed systems are negative.  Fatigue Swelling in the legs Spinning or rocking sensations Blurring of vision, eye pain Urinary incontinence Anemia, easy bruising Feeling hot, flushing Joint pain, joint swelling, aching muscles Allergies, skin sensitivity Memory loss, headache, numbness, weakness, slurred speech, difficulty swallowing, dizziness Anxiety, not enough sleep Insomnia, restless legs  Blood pressure 139/79, pulse 75, height 5\' 6"  (1.676 m), weight 140 lb 12 oz (63.8 kg).  Physical Exam  General: The patient is alert and cooperative at the time of the examination.  Eyes: Pupils are equal, round, and reactive to light. Discs are flat bilaterally.  Neck: The neck is supple, no carotid bruits are noted.  Respiratory: The respiratory examination is clear.  Cardiovascular: The cardiovascular examination reveals a regular rate and rhythm, no obvious murmurs or rubs are noted.  Skin: Extremities are without significant edema.  Neurologic Exam  Mental status: The patient is alert and oriented x 3 at the time of the examination. The patient has apparent normal recent and remote memory, with an apparently normal attention span and concentration ability.  Cranial nerves: Facial symmetry is present. There is good sensation of the face to pinprick and soft touch bilaterally. The strength of the facial muscles and the muscles to head turning and shoulder shrug are normal bilaterally. Speech is well enunciated, no aphasia or dysarthria is noted. Extraocular movements are full. Visual fields are full. The tongue is midline, and the patient has symmetric elevation of the soft palate. No obvious hearing deficits are noted.  Motor: The motor testing reveals 5 over 5 strength of all 4 extremities. Good symmetric motor tone is noted throughout.  Sensory: Sensory testing is intact to pinprick,  soft touch, vibration sensation, and position sense on all 4 extremities. No evidence of extinction is noted.  Coordination: Cerebellar testing reveals good finger-nose-finger and heel-to-shin bilaterally.  Gait and station: Gait is normal. Tandem gait is normal. Romberg is negative. No drift is seen.  Reflexes: Deep tendon reflexes are symmetric and normal bilaterally. Toes are downgoing bilaterally.   Assessment/Plan:  1. Subjective sensory alteration  The patient has a normal clinical examination. She reports unusual sensory alterations on her entire body, she has burning sensations of her midsection that completely disappear with standing, come on immediately with sitting. The etiology of this is not clear, she will be sent back for MRI of the brain and cervical spine, further blood work will be done. She will be given a trial on carbamazepine for the pain. She will follow-up in 4 months.   Marlan Palau. Keith Willis MD 04/18/2016 9:40 AM  [facility] [address] [place] [id number]  Phone [phone] Fax [phone]

## 2016-04-18 NOTE — Telephone Encounter (Signed)
Pt wants MRI here Before 04-29-16 if possible due to ins change

## 2016-04-23 LAB — GLIADIN ANTIBODIES, SERUM
Antigliadin Abs, IgA: 3 units (ref 0–19)
Gliadin IgG: 2 units (ref 0–19)

## 2016-04-23 LAB — COMPREHENSIVE METABOLIC PANEL
ALT: 13 IU/L (ref 0–32)
AST: 18 IU/L (ref 0–40)
Albumin/Globulin Ratio: 1.8 (ref 1.2–2.2)
Albumin: 4.7 g/dL (ref 3.5–5.5)
Alkaline Phosphatase: 69 IU/L (ref 39–117)
BUN/Creatinine Ratio: 15 (ref 9–23)
BUN: 12 mg/dL (ref 6–24)
Bilirubin Total: 0.9 mg/dL (ref 0.0–1.2)
CALCIUM: 9.5 mg/dL (ref 8.7–10.2)
CHLORIDE: 99 mmol/L (ref 96–106)
CO2: 27 mmol/L (ref 18–29)
Creatinine, Ser: 0.8 mg/dL (ref 0.57–1.00)
GFR, EST AFRICAN AMERICAN: 102 mL/min/{1.73_m2} (ref >59)
GFR, EST NON AFRICAN AMERICAN: 89 mL/min/{1.73_m2} (ref >59)
GLOBULIN, TOTAL: 2.6 g/dL (ref 1.5–4.5)
GLUCOSE: 110 mg/dL — AB (ref 65–99)
POTASSIUM: 4.5 mmol/L (ref 3.5–5.2)
Sodium: 140 mmol/L (ref 134–144)
TOTAL PROTEIN: 7.3 g/dL (ref 6.0–8.5)

## 2016-04-23 LAB — METHYLMALONIC ACID, SERUM: METHYLMALONIC ACID: 167 nmol/L (ref 0–378)

## 2016-04-23 LAB — PARANEOPLASTIC PROFILE 1
Neuronal Nuc Ab (Ri), IFA: <1:10 {titer}
Neuronal Nuclear (Hu) Antibody (IB): <1:10 {titer}
Purkinje Cell (Yo) Autoantobodies- IFA: <1:10 {titer}

## 2016-04-23 LAB — SEDIMENTATION RATE: Sed Rate: 2 mm/hr (ref 0–32)

## 2016-04-23 LAB — HTLV I+II ANTIBODIES, (EIA), BLD: HTLV I/II Ab: NEGATIVE

## 2016-04-23 LAB — VITAMIN B12: Vitamin B-12: 521 pg/mL (ref 211–946)

## 2016-04-25 ENCOUNTER — Ambulatory Visit (INDEPENDENT_AMBULATORY_CARE_PROVIDER_SITE_OTHER): Payer: BLUE CROSS/BLUE SHIELD

## 2016-04-25 DIAGNOSIS — R209 Unspecified disturbances of skin sensation: Secondary | ICD-10-CM | POA: Diagnosis not present

## 2016-04-26 MED ORDER — GADOPENTETATE DIMEGLUMINE 469.01 MG/ML IV SOLN: 13.0000 mL | Freq: Once | INTRAVENOUS | Status: AC | PRN

## 2016-04-27 ENCOUNTER — Telehealth: Payer: Self-pay | Admitting: Neurology

## 2016-04-27 MED ORDER — BACLOFEN 10 MG PO TABS: 5.0000 mg | ORAL_TABLET | Freq: Two times a day (BID) | ORAL | 1 refills | Status: DC

## 2016-04-27 NOTE — Telephone Encounter (Signed)
I called the patient. MRI the brain and cervical spine shows no central nervous system lesions, evidence of sinusitis on MRI the brain. The patient could not tolerate the carbamazepine secondary to nausea and headache. We will try baclofen, the patient has been on Topamax, Lyrica, Cymbalta, and gabapentin without benefit in the past.   MRI brain 04/26/16:  IMPRESSION:  This MRI of the brain with and without contrast shows the following: 1.   Maxillary, sphenoid and ethmoid chronic sinusitis. 2.   Brain parenchyma appears normal before and after contrast administration. 3.   There are no acute findings.   MRI cervical 04/26/16:   IMPRESSION:  This MRI of the cervical spine with and without contrast shows the following: 1.    The spinal cord appears normal. 2.    Small disc bulges at C5-C6 and T1-T2 that do not lead to any nerve root impingement. 3.    There is a normal enhancement pattern.

## 2016-05-04 ENCOUNTER — Encounter: Payer: Self-pay | Admitting: Neurology

## 2016-05-07 ENCOUNTER — Other Ambulatory Visit: Payer: Self-pay | Admitting: Neurology

## 2016-05-07 DIAGNOSIS — G894 Chronic pain syndrome: Secondary | ICD-10-CM

## 2016-08-14 ENCOUNTER — Encounter: Payer: Self-pay | Admitting: Neurology

## 2016-08-21 ENCOUNTER — Ambulatory Visit: Payer: BLUE CROSS/BLUE SHIELD | Admitting: Neurology

## 2017-04-18 ENCOUNTER — Encounter: Payer: Self-pay | Admitting: Family Medicine

## 2018-02-12 ENCOUNTER — Telehealth: Payer: Self-pay | Admitting: Neurology

## 2018-02-12 NOTE — Telephone Encounter (Signed)
[facility] [phone]  [phone]

## 2021-10-28 DIAGNOSIS — R200 Anesthesia of skin: Secondary | ICD-10-CM | POA: Insufficient documentation

## 2022-01-11 ENCOUNTER — Ambulatory Visit: Payer: BLUE CROSS/BLUE SHIELD | Admitting: Cardiology

## 2022-03-21 NOTE — Progress Notes (Unsigned)
HPI: Sandy Brennan is a 51 y.o. female, who is here today to re-establish care.  Former PCP: Dr Arnetha Gula. Last preventive routine visit: 06/06/21  Chronic medical problems: Demyelinating disease , insomnia,IFG,abdominal pain/bloating sensation,,hypothyroidism,chronic pain (myalgias and arthralgias), HLD,IBS,GERD  Insomnia: She has tried different medication and they have not help or did not tolerate well. Lunesta,Amben,Valium,melatonin,Mg,,CBD,and Trazodone, the latter one caused night mares. Sleeps 1-3 hours.  Hypothyroidism: S/P iodine radiation treatment in 2010. She is on Levothyroxine 112 mcg daily.   Concerns today: right lateral thigh pain and inner numbness sensation since 10/2021. Pain improves with movement and exacerbated at rest, worse late afternoon, around 6 pm. Pain interferes with sleep.  She reports having EMG and thoracic/cervical MRI. Follows with neurologist.  She is concerned about possible bone infection,would like X ray done. Right groin tenderness and edema. She has had US done and according to pt, did not show cause of pain. It is stable.  Generalized pain, myalgias and arthralgias, getting worse. Now she has pain when lying on cough or in the bathtub, like she lying on concrete. Excruciated bone pain. She has tried Lyrica,Gabapentin, and Duloxetine. Her neurologist recommended Nortriptyline 10 mg, started yesterday. Positive ANA's.  Follows with rheumatologist. She has been on Plaquenil but cause leg crams and night sweats.  Still having night sweats, wonders if this is caused by menopause, she would like hormones check. LMP 11/2021.  Recently evaluated by dermatologist and underwent skin bx for erythematous rash,edema, and feet bullae, improving. Bx result is still pending. She has an appt with wound clinic.  She takes Triamterene-HCTZ 75-50 mg daily for edema.  Review of Systems  Constitutional:  Positive for activity change and fatigue.  Negative for appetite change and fever.  HENT:  Negative for mouth sores, nosebleeds and trouble swallowing.   Eyes:  Negative for redness and visual disturbance.  Respiratory:  Negative for cough, shortness of breath and wheezing.   Cardiovascular:  Negative for chest pain and palpitations.  Gastrointestinal:  Positive for abdominal pain (Chronic.) and nausea (sometimes.). Negative for vomiting.       Negative for changes in bowel habits.  Endocrine: Negative for cold intolerance, heat intolerance, polydipsia, polyphagia and polyuria.  Genitourinary:  Negative for decreased urine volume, dysuria and hematuria.  Musculoskeletal:  Positive for arthralgias, back pain and myalgias. Negative for gait problem.  Neurological:  Positive for numbness. Negative for tremors, syncope, weakness and headaches.  Rest see pertinent positives and negatives per HPI.  Current Outpatient Medications on File Prior to Visit  Medication Sig Dispense Refill   esomeprazole (NEXIUM) 20 MG capsule Take 20 mg by mouth 2 (two) times daily before a meal.     Eszopiclone 3 MG TABS Take 3 mg by mouth at bedtime.     fluticasone (FLONASE) 50 MCG/ACT nasal spray Place 1 spray into both nostrils daily.     hydroxychloroquine (PLAQUENIL) 200 MG tablet Take 200 mg by mouth 2 (two) times daily.     levothyroxine (SYNTHROID) 112 MCG tablet Take 112 mcg by mouth daily.     lubiprostone (AMITIZA) 24 MCG capsule Take 1 capsule by mouth in the morning and at bedtime.     meloxicam (MOBIC) 15 MG tablet Take 1 tablet (15 mg total) by mouth daily. 30 tablet 1   Naltrexone HCl, Pain, 1.5 MG CAPS Take 2 capsules by mouth daily.     nortriptyline (PAMELOR) 10 MG capsule Take 10 mg by mouth at bedtime.     ondansetron (ZOFRAN) 4  MG tablet Take 4 mg by mouth every 8 (eight) hours as needed for nausea or vomiting.     Potassium 99 MG TABS Take 99 mg by mouth daily.     pravastatin (PRAVACHOL) 20 MG tablet Take 20 mg by mouth daily.      triamterene-hydrochlorothiazide (MAXZIDE) 75-50 MG tablet Take 1 tablet by mouth daily.     Current Facility-Administered Medications on File Prior to Visit  Medication Dose Route Frequency Provider Last Rate Last Admin   gadopentetate dimeglumine (MAGNEVIST) injection 13 mL  13 mL Intravenous Once PRN York Spaniel, MD       Past Medical History:  Diagnosis Date   Allergy 1973   Arthritis 2015   Disturbance of skin sensation 03/27/2013   GERD (gastroesophageal reflux disease) 2023   Graves disease    Currently hypothyroid   H/O radioactive iodine thyroid ablation    Hypertension 2005   Migraine without aura, without mention of intractable migraine without mention of status migrainosus    Neuromuscular disorder (HCC) 2014   Vitamin B12 deficiency    Allergies  Allergen Reactions   Codeine Swelling   Erythromycin Swelling   Penicillins Swelling   Prednisone Swelling    Family History  Problem Relation Age of Onset   Colon cancer Father    Cancer Father    Early death Father    Cancer Mother        Breast cancer   AAA (abdominal aortic aneurysm) Mother    Arthritis Mother    Diabetes Maternal Grandmother    Ovarian cancer Maternal Grandmother    Lung cancer Paternal Grandmother    Lung cancer Paternal Grandfather    Intellectual disability Son     Social History   Socioeconomic History   Marital status: Married    Spouse name: Not on file   Number of children: 2   Years of education: 2 yrs college   Highest education level: Not on file  Occupational History   Occupation: Self-employed  Tobacco Use   Smoking status: Former    Types: Cigarettes    Quit date: 04/18/1994    Years since quitting: 27.9   Smokeless tobacco: Never  Substance and Sexual Activity   Alcohol use: Yes    Alcohol/week: 5.0 standard drinks of alcohol    Types: 5 Glasses of wine per week    Comment: 1 glass per day   Drug use: No   Sexual activity: Yes    Partners: Male    Birth  control/protection: Surgical    Comment: Married  Other Topics Concern   Not on file  Social History Narrative   Lives at home w/ her husband and 2 children   Right-handed   Caffeine: 2 cups per day   Social Determinants of Health   Financial Resource Strain: Not on file  Food Insecurity: Not on file  Transportation Needs: Not on file  Physical Activity: Not on file  Stress: Not on file  Social Connections: Not on file   Vitals:   03/23/22 0814  BP: 128/80  Pulse: 79  Resp: 12  Temp: 98 F (36.7 C)  SpO2: 97%   Body mass index is 24.53 kg/m.  Physical Exam Vitals and nursing note reviewed.  Constitutional:      General: She is not in acute distress.    Appearance: She is well-developed.  HENT:     Head: Normocephalic and atraumatic.     Mouth/Throat:     Mouth: Mucous  membranes are moist.     Pharynx: Oropharynx is clear.  Eyes:     Conjunctiva/sclera: Conjunctivae normal.  Cardiovascular:     Rate and Rhythm: Normal rate and regular rhythm.     Pulses:          Dorsalis pedis pulses are 2+ on the right side and 2+ on the left side.     Heart sounds: No murmur heard. Pulmonary:     Effort: Pulmonary effort is normal. No respiratory distress.     Breath sounds: Normal breath sounds.  Abdominal:     Palpations: Abdomen is soft. There is no mass.     Tenderness: There is no abdominal tenderness.  Musculoskeletal:     Right lower leg: No tenderness or bony tenderness. 1+ Pitting Edema present.     Left lower leg: 1+ Pitting Edema present.  Lymphadenopathy:     Cervical: No cervical adenopathy.     Comments: Tenderness upon palpation of right inguinal area, no erythema or clear mass.  Skin:    General: Skin is warm.     Comments: Scattered superficial excoriations on dorsum feet, mild macular erythema scatted on LE's.  Neurological:     General: No focal deficit present.     Mental Status: She is alert and oriented to person, place, and time.     Cranial  Nerves: No cranial nerve deficit.     Gait: Gait normal.  Psychiatric:        Mood and Affect: Mood is anxious.     Comments: Well groomed, good eye contact.   ASSESSMENT AND PLAN:  Sandy Brennan was seen today for establish care.  Diagnoses and all orders for this visit: Orders Placed This Encounter  Procedures   DG FEMUR, MIN 2 VIEWS RIGHT   Heavy Metals Profile, Urine   Follicle stimulating hormone   T3, free   T4, free   TSH   CBC with Differential/Platelet   Lab Results  Component Value Date   TSH 0.48 03/23/2022   Lab Results  Component Value Date   WBC 6.5 03/23/2022   HGB 12.8 03/23/2022   HCT 37.3 03/23/2022   MCV 95.3 03/23/2022   PLT 223.0 03/23/2022   Pain of right thigh Chronic. ? Radicular,fibromyalgia,neuropathy among some to consider. Further recommendations according to femur X ray. Continue Nortriptyline 10 mg daily.  Amenorrhea We discussed Dx criteria for menopause, explained that hormonal testing is not recommended. Perimenopause most likely. She follows with gyn.  Polyneuropathy, unspecified Chronic. Apparently she has had extensive work up including lumbar puncture. Following with neurologist. Continue Nortriptyline 10 mg daily. She tried Lyrica, she is willing to try again, 50 mg at bedtime.  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 Continue Levothyroxine 112 mcg daily. Further recommendations according to TSH result.  Bullous pemphigus Improving. Pending appt with wound clinic. Continue following with dermatologist.  Insomnia, unspecified type Nortriptyline 10 mg started last night. She has failed other treatments. Good sleep hygiene. Lyrica may help.  I spent a total of 47 minutes in both face to face and non face to face activities for this visit on the date of this encounter. During this time history was obtained and documented, examination was performed, prior labs reviewed, and assessment/plan  discussed.  Return in about 4 weeks (around 04/20/2022).  Selen Smucker G. [place], MD  [facility]. [place] office.

## 2022-03-23 ENCOUNTER — Ambulatory Visit (INDEPENDENT_AMBULATORY_CARE_PROVIDER_SITE_OTHER): Payer: PRIVATE HEALTH INSURANCE

## 2022-03-23 ENCOUNTER — Ambulatory Visit (INDEPENDENT_AMBULATORY_CARE_PROVIDER_SITE_OTHER): Payer: PRIVATE HEALTH INSURANCE | Admitting: Family Medicine

## 2022-03-23 ENCOUNTER — Encounter: Payer: Self-pay | Admitting: Family Medicine

## 2022-03-23 VITALS — BP 128/80 | HR 79 | Temp 98.0°F | Resp 12 | Ht 66.0 in | Wt 152.0 lb

## 2022-03-23 DIAGNOSIS — G629 Polyneuropathy, unspecified: Secondary | ICD-10-CM | POA: Diagnosis not present

## 2022-03-23 DIAGNOSIS — L109 Pemphigus, unspecified: Secondary | ICD-10-CM

## 2022-03-23 DIAGNOSIS — G4700 Insomnia, unspecified: Secondary | ICD-10-CM

## 2022-03-23 DIAGNOSIS — E890 Postprocedural hypothyroidism: Secondary | ICD-10-CM

## 2022-03-23 DIAGNOSIS — M79651 Pain in right thigh: Secondary | ICD-10-CM

## 2022-03-23 DIAGNOSIS — N912 Amenorrhea, unspecified: Secondary | ICD-10-CM | POA: Diagnosis not present

## 2022-03-23 LAB — CBC WITH DIFFERENTIAL/PLATELET
Basophils Absolute: 0.1 10*3/uL (ref 0.0–0.1)
Basophils Relative: 1.2 % (ref 0.0–3.0)
Eosinophils Absolute: 0.8 10*3/uL — ABNORMAL HIGH (ref 0.0–0.7)
Eosinophils Relative: 12.7 % — ABNORMAL HIGH (ref 0.0–5.0)
HCT: 37.3 % (ref 36.0–46.0)
Hemoglobin: 12.8 g/dL (ref 12.0–15.0)
Lymphocytes Relative: 35.4 % (ref 12.0–46.0)
Lymphs Abs: 2.3 10*3/uL (ref 0.7–4.0)
MCHC: 34.3 g/dL (ref 30.0–36.0)
MCV: 95.3 fl (ref 78.0–100.0)
Monocytes Absolute: 0.3 10*3/uL (ref 0.1–1.0)
Monocytes Relative: 5.3 % (ref 3.0–12.0)
Neutro Abs: 2.9 10*3/uL (ref 1.4–7.7)
Neutrophils Relative %: 45.4 % (ref 43.0–77.0)
Platelets: 223 10*3/uL (ref 150.0–400.0)
RBC: 3.92 Mil/uL (ref 3.87–5.11)
RDW: 12.9 % (ref 11.5–15.5)
WBC: 6.5 10*3/uL (ref 4.0–10.5)

## 2022-03-23 LAB — TSH: TSH: 0.48 u[IU]/mL (ref 0.35–5.50)

## 2022-03-23 LAB — FOLLICLE STIMULATING HORMONE: FSH: 46.5 m[IU]/mL

## 2022-03-23 LAB — T4, FREE: Free T4: 1.21 ng/dL (ref 0.60–1.60)

## 2022-03-23 LAB — T3, FREE: T3, Free: 2.8 pg/mL (ref 2.3–4.2)

## 2022-03-23 MED ORDER — PREGABALIN 50 MG PO CAPS: 50.0000 mg | ORAL_CAPSULE | Freq: Every day | ORAL | 1 refills | Status: DC

## 2022-03-23 NOTE — Patient Instructions (Addendum)
A few things to remember from today's visit:   Amenorrhea - Plan: Follicle stimulating hormone  Polyneuropathy, unspecified - Plan: Heavy Metals Profile, Urine, T3, free, T4, free, TSH, CBC with Differential/Platelet, pregabalin (LYRICA) 50 MG capsule  If you need refills please call your pharmacy. Do not use My Chart to request refills or for acute issues that need immediate attention.   Lyrica 50 mg at bedtime. I will see you back in 4 weeks. Please be sure medication list is accurate. If a new problem present, please set up appointment sooner than planned today.  Neuropathic Pain Neuropathic pain is pain caused by damage to the nerves that are responsible for certain sensations in your body (sensory nerves). Neuropathic pain can make you more sensitive to pain. Even a minor sensation can feel very painful. This is usually a long-term (chronic) condition that can be difficult to treat. The type of pain differs from person to person. It may: Start suddenly (acute), or it may develop slowly and become chronic. Come and go as damaged nerves heal, or it may stay at the same level for years. Cause emotional distress, loss of sleep, and a lower quality of life. What are the causes? The most common cause of this condition is diabetes. Many other diseases and conditions can also cause neuropathic pain. Causes of neuropathic pain can be classified as: Toxic. This is caused by medicines and chemicals. The most common causes of toxic neuropathic pain is damage from medicines that kill cancer cells (chemotherapy) or alcohol abuse. Metabolic. This can be caused by: Diabetes. Lack of vitamins like B12. Traumatic. Any injury that cuts, crushes, or stretches a nerve can cause damage and pain. Compression-related. If a sensory nerve gets trapped or compressed for a long period of time, the blood supply to the nerve can be cut off. Vascular. Many blood vessel diseases can cause neuropathic pain by  decreasing blood supply and oxygen to nerves. Autoimmune. This type of pain results from diseases in which the body's defense system (immune system) mistakenly attacks sensory nerves. Examples of autoimmune diseases that can cause neuropathic pain include lupus and multiple sclerosis. Infectious. Many types of viral infections can damage sensory nerves and cause pain. Shingles infection is a common cause of this type of pain. Inherited. Neuropathic pain can be a symptom of many diseases that are passed down through families (genetic). What increases the risk? You are more likely to develop this condition if: You have diabetes. You smoke. You drink too much alcohol. You are taking certain medicines, including chemotherapy or medicines that treat immune system disorders. What are the signs or symptoms? The main symptom is pain. Neuropathic pain is often described as: Burning. Shock-like. Stinging. Hot or cold. Itching. How is this diagnosed? No single test can diagnose neuropathic pain. It is diagnosed based on: A physical exam and your symptoms. Your health care provider will ask you about your pain. You may be asked to use a pain scale to describe how bad your pain is. Tests. These may be done to see if you have a cause and location of any nerve damage. They include: Nerve conduction studies and electromyography to test how well nerve signals travel through your nerves and muscles (electrodiagnostic testing). Skin biopsy to evaluate for small fiber neuropathy. Imaging studies, such as: X-rays. CT scan. MRI. How is this treated? Treatment for neuropathic pain may change over time. You may need to try different treatment options or a combination of treatments. Some options include: Treating  the underlying cause of the neuropathy, such as diabetes, kidney disease, or vitamin deficiencies. Stopping medicines that can cause neuropathy, such as chemotherapy. Medicine to relieve pain.  Medicines may include: Prescription or over-the-counter pain medicine. Anti-seizure medicine. Antidepressant medicines. Pain-relieving patches or creams that are applied to painful areas of skin. A medicine to numb the area (local anesthetic), which can be injected as a nerve block. Transcutaneous nerve stimulation. This uses electrical currents to block painful nerve signals. The treatment is painless. Alternative treatments, such as: Acupuncture. Meditation. Massage. Occupational or physical therapy. Pain management programs. Counseling. Follow these instructions at home: Medicines  Take over-the-counter and prescription medicines only as told by your health care provider. Ask your health care provider if the medicine prescribed to you: Requires you to avoid driving or using machinery. Can cause constipation. You may need to take these actions to prevent or treat constipation: Drink enough fluid to keep your urine pale yellow. Take over-the-counter or prescription medicines. Eat foods that are high in fiber, such as beans, whole grains, and fresh fruits and vegetables. Limit foods that are high in fat and processed sugars, such as fried or sweet foods. Lifestyle  Have a good support system at home. Consider joining a chronic pain support group. Do not use any products that contain nicotine or tobacco. These products include cigarettes, chewing tobacco, and vaping devices, such as e-cigarettes. If you need help quitting, ask your health care provider. Do not drink alcohol. General instructions Learn as much as you can about your condition. Work closely with all your health care providers to find the treatment plan that works best for you. Ask your health care provider what activities are safe for you. Keep all follow-up visits. This is important. Contact a health care provider if: Your pain treatments are not working. You are having side effects from your medicines. You are  struggling with tiredness (fatigue), mood changes, depression, or anxiety. Get help right away if: You have thoughts of hurting yourself. Get help right away if you feel like you may hurt yourself or others, or have thoughts about taking your own life. Go to your nearest emergency room or: Call 911. Call the National Suicide Prevention Lifeline at 6306673495 or 988. This is open 24 hours a day. Text the Crisis Text Line at (424)434-7857. Summary Neuropathic pain is pain caused by damage to the nerves that are responsible for certain sensations in your body (sensory nerves). Neuropathic pain may come and go as damaged nerves heal, or it may stay at the same level for years. Neuropathic pain is usually a long-term condition that can be difficult to treat. Consider joining a chronic pain support group. This information is not intended to replace advice given to you by your health care provider. Make sure you discuss any questions you have with your health care provider. Document Revised: 03/13/2021 Document Reviewed: 03/13/2021 Elsevier Patient Education  2023 [employer].

## 2022-03-25 ENCOUNTER — Encounter: Payer: Self-pay | Admitting: Family Medicine

## 2022-04-03 ENCOUNTER — Other Ambulatory Visit: Payer: Self-pay | Admitting: Family Medicine

## 2022-04-03 DIAGNOSIS — M79651 Pain in right thigh: Secondary | ICD-10-CM

## 2022-04-04 ENCOUNTER — Telehealth: Payer: Self-pay | Admitting: Family Medicine

## 2022-04-04 ENCOUNTER — Telehealth (HOSPITAL_COMMUNITY): Payer: Self-pay

## 2022-04-04 LAB — HEAVY METALS PROFILE, URINE
Arsenic, 24H Ur: <10 mcg/L (ref <=80)
Lead, 24 hr urine: <10 mcg/L (ref <80)
Mercury, 24H Ur: <4 mcg/L (ref <=20)

## 2022-04-04 NOTE — Telephone Encounter (Signed)
Unable to contact the patient at either number on file for them, both numbers say either "disconnected" or "your call can not be completed at this time".  Given the type of study I reached out to the referring office by phone to figure out the best way to move forward.  I was informed that this is the best way to document and route information to the provider.

## 2022-04-04 NOTE — Telephone Encounter (Signed)
I walked caller through how to send an encounter to MD. Please disregard.

## 2022-04-06 ENCOUNTER — Encounter: Payer: Self-pay | Admitting: Family Medicine

## 2022-04-06 NOTE — Telephone Encounter (Signed)
Patient is going to have the imaging done at [facility] in [place]. Order has been faxed.

## 2022-04-30 ENCOUNTER — Ambulatory Visit: Payer: BLUE CROSS/BLUE SHIELD | Admitting: Family Medicine

## 2022-05-21 ENCOUNTER — Ambulatory Visit: Payer: BLUE CROSS/BLUE SHIELD | Admitting: Family Medicine

## 2022-05-28 ENCOUNTER — Ambulatory Visit: Payer: BLUE CROSS/BLUE SHIELD | Admitting: Family Medicine

## 2022-06-12 ENCOUNTER — Ambulatory Visit: Payer: BLUE CROSS/BLUE SHIELD | Admitting: Family Medicine

## 2022-06-18 ENCOUNTER — Ambulatory Visit: Payer: BLUE CROSS/BLUE SHIELD | Admitting: Family Medicine

## 2022-06-24 NOTE — Progress Notes (Signed)
HPI: Sandy Brennan is a 51 y.o. female with medical hx significant for insomnia, hypothyroidism,GERD,IBS, and chronic pain synd here today for follow up.  Last seen on 03/23/22. Last visit Lyrica was recommended to help with disturbance of skin sensation, feeling like her skin is in fire.  In 01/2022 Cervical,thoracic,and lumbar MRI with no significant abnormalities.No evidence of demyelinating disease. 3 mm enhancing nodule posterior to the distal spinal cord at T12 and mild enhancement of nerve roots of the cauda equina; cannot entirely exclude leptomeningeal carcinomatosis and lumbar puncture may be useful for further evaluation.  Multilevel thoracic disc protrusions  Very small central herniated nucleus pulposis at C7-T1 and T1-T2.   She has had spinal tap in 01/2022.  She reports discontinuing nortriptyline and Lyrica due to adverse effects, including nausea.  She has tried Gabapentin and duloxetine. Last visit she was on Nortriptyline, which was recommended by her neurologist.  She has seen a neurologist for dysesthesia and has been referred to a new rheumatologist and neurologist at [facility].   She describes significant pain and discomfort, mainly around her waist and lower back, with a pain scale of 6 in the morning, increasing to 8 around lunchtime, and almost reaching 9 by late afternoon. She experiences numbness on the right thigh and has recently started experiencing numbness on the left one as well. Negative for saddle anesthesia or bladder/bowel incontinence. She takes Ibuprofen 400 gm and Benadryl 50 mg daily as needed, these seem to help some.  Generalized pain,myalgias and arthralgias, She has not taken any controlled medications for pain management but is willing to try Tramadol. The patient's daughter is also experiencing similar issues. She uses a TENS unit for pain management and reports difficulty sitting due to pain and stiffness.  She experiences difficulty getting in and  out of the bathtub and often feels like she is going to pass out after soaking in Epsom salt baths, which helps with musculoskeletal pain.  Pain is limiting 50% of her daily activities.  She is also reporting difficulty losing weight and has gained some,states that she is 20 Lb overweight. She used to exercise daily but has not been able to do so because pain. She "hardly" eats. She wonders if she could have a prescription for wt loss medication. States that she had two C-sections and reports hanging belly fat, feels like if she loses wt pain will improve.  Still having LE edema, which worsens afternoon. She has tried compression stockings but has not found them helpful.  Negative for palpitations, orthopnea, PND, CP,or SOB. She takes Triamterene-HCTZ 75-50 mg daily for edema.   Insomnia: The patient has difficulty sleeping and has tried various medications, including Trazodone, Seroquel,Valium,melatonin,Mg,CBD, Ambien, and hydroxyzine, with limited success.  She is currently taking Lunesta 3 mg at bedtime, which allows her to remain calm while lying down but does not provide adequate sleep.    Unhealed areas on her lower back, weeping and oozing sometimes. She used salonpas patches for pain, noted lesion after use. She has seen dermatologist a few months ago for LE rash and bullae lesions, Dx'ed with "vascular eczema."  Review of Systems  Constitutional:  Positive for appetite change and fatigue. Negative for fever.  HENT:  Negative for mouth sores and sore throat.   Respiratory:  Negative for cough and wheezing.   Gastrointestinal:  Positive for abdominal pain (Chronic.). Negative for nausea and vomiting.       Negative for changes in bowel habits.  Genitourinary:  Negative for  decreased urine volume, dysuria and hematuria.  Musculoskeletal:  Positive for arthralgias, back pain and myalgias. Negative for gait problem.  Neurological:  Negative for tremors, syncope, weakness and headaches.   Hematological:  Negative for adenopathy. Does not bruise/bleed easily.  Psychiatric/Behavioral:  Positive for sleep disturbance. Negative for confusion and hallucinations. The patient is nervous/anxious.   See other pertinent positives and negatives in HPI.  Current Outpatient Medications on File Prior to Visit  Medication Sig Dispense Refill   esomeprazole (NEXIUM) 20 MG capsule Take 20 mg by mouth 2 (two) times daily before a meal.     Eszopiclone 3 MG TABS Take 3 mg by mouth at bedtime.     fluticasone (FLONASE) 50 MCG/ACT nasal spray Place 1 spray into both nostrils daily.     hydroxychloroquine (PLAQUENIL) 200 MG tablet Take 200 mg by mouth 2 (two) times daily.     levothyroxine (SYNTHROID) 112 MCG tablet Take 112 mcg by mouth daily.     lubiprostone (AMITIZA) 24 MCG capsule Take 1 capsule by mouth in the morning and at bedtime.     meloxicam (MOBIC) 15 MG tablet Take 1 tablet (15 mg total) by mouth daily. 30 tablet 1   nortriptyline (PAMELOR) 50 MG capsule Take 50 mg by mouth at bedtime.     ondansetron (ZOFRAN) 4 MG tablet Take 4 mg by mouth every 8 (eight) hours as needed for nausea or vomiting.     Potassium 99 MG TABS Take 99 mg by mouth daily.     pravastatin (PRAVACHOL) 20 MG tablet Take 20 mg by mouth daily.     triamterene-hydrochlorothiazide (MAXZIDE) 75-50 MG tablet Take 1 tablet by mouth daily.     Current Facility-Administered Medications on File Prior to Visit  Medication Dose Route Frequency Provider Last Rate Last Admin   gadopentetate dimeglumine (MAGNEVIST) injection 13 mL  13 mL Intravenous Once PRN [place] SpanielWillis, Charles K, MD        Past Medical History:  Diagnosis Date   Allergy 1973   Arthritis 2015   Disturbance of skin sensation 03/27/2013   GERD (gastroesophageal reflux disease) 2023   Graves disease    Currently hypothyroid   H/O radioactive iodine thyroid ablation    Hypertension 2005   Migraine without aura, without mention of intractable migraine  without mention of status migrainosus    Neuromuscular disorder (HCC) 2014   Vitamin B12 deficiency    Allergies  Allergen Reactions   Codeine Swelling   Erythromycin Swelling   Penicillins Swelling   Prednisone Swelling    Social History   Socioeconomic History   Marital status: Married    Spouse name: Not on file   Number of children: 2   Years of education: 2 yrs college   Highest education level: Associate degree: academic program  Occupational History   Occupation: Self-employed  Tobacco Use   Smoking status: Former    Types: Cigarettes    Quit date: 04/18/1994    Years since quitting: 28.2   Smokeless tobacco: Never  Substance and Sexual Activity   Alcohol use: Yes    Alcohol/week: 5.0 standard drinks of alcohol    Types: 5 Glasses of wine per week    Comment: 1 glass per day   Drug use: No   Sexual activity: Yes    Partners: Male    Birth control/protection: Surgical    Comment: Married  Other Topics Concern   Not on file  Social History Narrative   Lives at home w/  her husband and 2 children   Right-handed   Caffeine: 2 cups per day   Social Determinants of Health   Financial Resource Strain: Low Risk  (06/25/2022)   Overall Financial Resource Strain (CARDIA)    Difficulty of Paying Living Expenses: Not very hard  Food Insecurity: Unknown (06/25/2022)   Hunger Vital Sign    Worried About Running Out of Food in the Last Year: Patient refused    Ran Out of Food in the Last Year: Patient refused  Transportation Needs: No Transportation Needs (06/25/2022)   PRAPARE - Administrator, Civil Service (Medical): No    Lack of Transportation (Non-Medical): No  Physical Activity: Insufficiently Active (06/25/2022)   Exercise Vital Sign    Days of Exercise per Week: 3 days    Minutes of Exercise per Session: 10 min  Stress: Stress Concern Present (06/25/2022)   [employer] of Occupational Health - Occupational Stress Questionnaire    Feeling  of Stress : Rather much  Social Connections: Unknown (06/25/2022)   Social Connection and Isolation Panel [NHANES]    Frequency of Communication with Friends and Family: Twice a week    Frequency of Social Gatherings with Friends and Family: Once a week    Attends Religious Services: Patient refused    Active Member of Clubs or Organizations: No    Attends Banker Meetings: Not on file    Marital Status: Married   Vitals:   06/25/22 0759  BP: 118/70  Pulse: 77  Resp: 12  Temp: 97.7 F (36.5 C)  SpO2: 99%   Body mass index is 24.07 kg/m.  Physical Exam Vitals and nursing note reviewed.  Constitutional:      General: She is not in acute distress.    Appearance: She is well-developed.  HENT:     Head: Normocephalic and atraumatic.  Eyes:     Conjunctiva/sclera: Conjunctivae normal.  Cardiovascular:     Rate and Rhythm: Normal rate and regular rhythm.     Pulses:          Dorsalis pedis pulses are 2+ on the right side and 2+ on the left side.     Heart sounds: No murmur heard. Pulmonary:     Effort: Pulmonary effort is normal. No respiratory distress.     Breath sounds: Normal breath sounds.  Abdominal:     Palpations: Abdomen is soft. There is no mass.     Tenderness: There is no abdominal tenderness.  Musculoskeletal:     Lumbar back: Tenderness present. No bony tenderness.       Back:     Right lower leg: No tenderness or bony tenderness. No edema.     Left lower leg: No edema.  Lymphadenopathy:     Cervical: No cervical adenopathy.     Comments: Tenderness upon palpation of right inguinal area, no erythema or clear mass.  Skin:    General: Skin is warm.     Findings: Rash present. Rash is macular. Rash is not vesicular.          Comments: Defined borders, square pattern on lower back, bilateral.   Neurological:     General: No focal deficit present.     Mental Status: She is alert and oriented to person, place, and time.     Cranial Nerves: No  cranial nerve deficit.     Gait: Gait normal.  Psychiatric:        Mood and Affect: Mood is anxious.  ASSESSMENT AND PLAN:  Sandy Brennan was seen today for follow-up.  Diagnoses and all orders for this visit:  Insomnia Problem is not well controlled. We discussed possible etiologies, including psychotic disorders. She has tried Seroquel in the past, she does not remember the reason for which she stopped medication.  She reports trying different medications and Lunesta 3 mg is the one that has helped the most. No changes in Lunesta dose. In the future we could consider adding Lamictal to treat possible mood disorder, may also help with pain.  Disturbance of skin sensation Unknown etiology. She did not tolerate Lyrica well, discontinued because of nausea. Pending appointment with neurologist at [facility].  Bilateral lower extremity edema Problem has improved since her last visit but not resolved. Currently she is on triamterene-HCTZ 75-50 mg daily. Vein disease can be a contributing factor, she has tried compression stockings in the past, did not seem to help much. Continue appropriate skin care.  Chronic pain syndrome Problem is getting worse, affecting ADLs. We discussed a few options, she agrees with trying tramadol 50 mg twice daily.  We reviewed some side effects. Appointment with rheumatologist at [facility] is pending. Follow-up in 2 months, before if needed.  Skin rash  Pattern of skin rash on lower back suggest contact dermatitis. She has seen dermatologist. Continue monitoring for changes. Takes Benadryl for pain.   I spent a total of 46 minutes in both face to face and non face to face activities for this visit on the date of this encounter. During this time history was obtained and documented, examination was performed, prior labs/imaging reviewed, and assessment/plan discussed.  Return in about 2 months (around 08/25/2022) for chronic problems.  Jahzeel Poythress G. [place],  MD  [facility]. [place] office.

## 2022-06-25 ENCOUNTER — Ambulatory Visit (INDEPENDENT_AMBULATORY_CARE_PROVIDER_SITE_OTHER): Payer: PRIVATE HEALTH INSURANCE | Admitting: Family Medicine

## 2022-06-25 VITALS — BP 118/70 | HR 77 | Temp 97.7°F | Resp 12 | Ht 66.0 in | Wt 149.1 lb

## 2022-06-25 DIAGNOSIS — G4700 Insomnia, unspecified: Secondary | ICD-10-CM | POA: Insufficient documentation

## 2022-06-25 DIAGNOSIS — R600 Localized edema: Secondary | ICD-10-CM

## 2022-06-25 DIAGNOSIS — G894 Chronic pain syndrome: Secondary | ICD-10-CM | POA: Insufficient documentation

## 2022-06-25 DIAGNOSIS — R209 Unspecified disturbances of skin sensation: Secondary | ICD-10-CM

## 2022-06-25 DIAGNOSIS — R21 Rash and other nonspecific skin eruption: Secondary | ICD-10-CM

## 2022-06-25 MED ORDER — TRAMADOL HCL 50 MG PO TABS: 50.0000 mg | ORAL_TABLET | Freq: Two times a day (BID) | ORAL | 0 refills | Status: DC | PRN

## 2022-06-25 NOTE — Assessment & Plan Note (Addendum)
Problem is not well controlled. We discussed possible etiologies, including psychotic disorders. She has tried Seroquel in the past, she does not remember the reason for which she stopped medication.  She reports trying different medications and Lunesta 3 mg is the one that has helped the most. No changes in Lunesta dose. In the future we could consider adding Lamictal to treat possible mood disorder, may also help with pain.

## 2022-06-25 NOTE — Assessment & Plan Note (Signed)
Problem is getting worse, affecting ADLs. We discussed a few options, she agrees with trying tramadol 50 mg twice daily.  We reviewed some side effects. Appointment with rheumatologist at [facility] is pending. Follow-up in 2 months, before if needed.

## 2022-06-25 NOTE — Assessment & Plan Note (Signed)
Unknown etiology. She did not tolerate Lyrica well, discontinued because of nausea. Pending appointment with neurologist at [facility].

## 2022-06-25 NOTE — Assessment & Plan Note (Signed)
Problem has improved since her last visit but not resolved. Currently she is on triamterene-HCTZ 75-50 mg daily. Vein disease can be a contributing factor, she has tried compression stockings in the past, did not seem to help much. Continue appropriate skin care.

## 2022-06-25 NOTE — Patient Instructions (Addendum)
A few things to remember from today's visit:  Disturbance of skin sensation  Bilateral lower extremity edema  Insomnia, unspecified type  Chronic pain syndrome - Plan: traMADol (ULTRAM) 50 MG tablet  Tramadol 50 mg added today, it causes drowsiness. Let me know if it helps with pain.  No changes in [place].  If you need refills for medications you take chronically, please call your pharmacy. Do not use My Chart to request refills or for acute issues that need immediate attention. If you send a my chart message, it may take a few days to be addressed, specially if I am not in the office.  Please be sure medication list is accurate. If a new problem present, please set up appointment sooner than planned today.

## 2022-06-26 ENCOUNTER — Encounter: Payer: Self-pay | Admitting: Family Medicine

## 2022-08-04 ENCOUNTER — Other Ambulatory Visit: Payer: Self-pay | Admitting: Family Medicine

## 2022-08-04 DIAGNOSIS — G894 Chronic pain syndrome: Secondary | ICD-10-CM

## 2022-08-06 MED ORDER — ESZOPICLONE 3 MG PO TABS: 3.0000 mg | ORAL_TABLET | Freq: Every day | ORAL | 2 refills | Status: DC

## 2022-08-28 ENCOUNTER — Encounter: Payer: Self-pay | Admitting: Family Medicine

## 2022-08-29 ENCOUNTER — Ambulatory Visit: Payer: PRIVATE HEALTH INSURANCE | Admitting: Family Medicine

## 2022-09-04 NOTE — Progress Notes (Unsigned)
Chief Complaint  Patient presents with   Medical Management of Chronic Issues    Following up on leg swelling   HPI: Sandy Brennan is a 52 y.o. female with PMHx significant for insomnia, hypothyroidism,GERD,IBS, and chronic pain synd here today for follow up.  She was last seen on 06/25/22, when started Tramadol for pain management, pain "all over." She is taking medication at bedtime because it causes drowsiness and she feels like it has helped with pain. No significant side effects reported.  States that she has gained seven pounds since the last visit. She attempted to increase physical activity by walking on a treadmill.  She experienced one episode of chest pain, she felt like pain originates in the upper abdomen and radiates to the mid lower chest, with no associated palpitation,SOB,diaphoresis,or heartburn.  She usually does not have exertional CP.  Hx of constipation, which has not been aggravated by Tramadol. She takes OTC laxatives or prune juice to have bowel movements every two days.  Today she is reporting difficulty swallowing pills, but not solid foods or liquids.  She finds it difficult to breathe and swallow when her legs are elevated. The most comfortable position is lying flat on a bed with her head elevated.   Generalized myalgias,arthralgias,and areas of numbness. She describes a sensation of "skin peeling off" her legs, believes symptoms are caused by "poor circulation" in the lower extremities.  Hx of positive ANA's. Followed with rheumatologist. She has been on Plaquenil but cause leg crams and night sweats.  LE numbness and pain as well as edema. LE edema is almost gone in the morning when she gets up and gets worse at the day goes, worse at night.  She is on Triamterene-HCTZ 75-50 mg and wonders if dose needs to be increased.  Bullae lesions have resolved, still has erythematous scaly rash on LE's. She follows with dermatologist, applying topical  medications as instructed. She has noted "indents" on anterior thighs. Compression stockings exacerbate LE's muscle pain.  She has been on Gabapentin,Lyrica, Nortriptyline,and Duloxetine in the past but did not tolerate well. They caused nausea and "mood changes." She feels like her symptoms are gradually getting worse. She still works full time and is in school.  She has seen different providers, has undergone extensive work up, including brain MRI (02/15/22), lumbar,thoracic,and cervical MRI, and CSF analysis.  Pending to see neurologist at [facility].  Insomnia:She is on Lunesta 3 mg at bedtime, which she has taken for years and it the "only one" that has helped. She has tried various medications, including Trazodone, Seroquel,Valium,melatonin,Mg,CBD, Ambien, and hydroxyzine, with limited success.  She reports waking up frequently at night due to pain and only sleeping an average of 3.5 hours at the time.   Review of Systems  Constitutional:  Positive for fatigue. Negative for chills and fever.  HENT:  Negative for mouth sores and sore throat.   Respiratory:  Negative for cough and wheezing.   Gastrointestinal:  Negative for abdominal pain, nausea and vomiting.  Genitourinary:  Negative for decreased urine volume and hematuria.  Neurological:  Negative for syncope and facial asymmetry.  Hematological:  Negative for adenopathy. Does not bruise/bleed easily.  Psychiatric/Behavioral:  Positive for sleep disturbance. Negative for hallucinations. The patient is nervous/anxious.   See other pertinent positives and negatives in HPI.  Current Outpatient Medications on File Prior to Visit  Medication Sig Dispense Refill   Eszopiclone 3 MG TABS Take 1 tablet (3 mg total) by mouth at bedtime. 30 tablet  2   fluticasone (FLONASE) 50 MCG/ACT nasal spray Place 1 spray into both nostrils daily.     levothyroxine (SYNTHROID) 112 MCG tablet Take 112 mcg by mouth daily.     meloxicam (MOBIC) 15 MG tablet  Take 1 tablet (15 mg total) by mouth daily. 30 tablet 1   ondansetron (ZOFRAN) 4 MG tablet Take 4 mg by mouth every 8 (eight) hours as needed for nausea or vomiting.     Potassium 99 MG TABS Take 99 mg by mouth daily.     traMADol (ULTRAM) 50 MG tablet TAKE 1 TABLET BY MOUTH EVERY 12 HOURS AS NEEDED. 30 tablet 0   triamterene-hydrochlorothiazide (MAXZIDE) 75-50 MG tablet Take 1 tablet by mouth daily.     Current Facility-Administered Medications on File Prior to Visit  Medication Dose Route Frequency Provider Last Rate Last Admin   gadopentetate dimeglumine (MAGNEVIST) injection 13 mL  13 mL Intravenous Once PRN York Spaniel, MD        Past Medical History:  Diagnosis Date   Allergy 1973   Arthritis 2015   Disturbance of skin sensation 03/27/2013   GERD (gastroesophageal reflux disease) 2023   Graves disease    Currently hypothyroid   H/O radioactive iodine thyroid ablation    Hypertension 2005   Migraine without aura, without mention of intractable migraine without mention of status migrainosus    Neuromuscular disorder (HCC) 2014   Vitamin B12 deficiency    Allergies  Allergen Reactions   Codeine Swelling   Erythromycin Swelling   Penicillins Swelling   Prednisone Swelling    Social History   Socioeconomic History   Marital status: Married    Spouse name: Not on file   Number of children: 2   Years of education: 2 yrs college   Highest education level: Associate degree: academic program  Occupational History   Occupation: Self-employed  Tobacco Use   Smoking status: Former    Types: Cigarettes    Quit date: 04/18/1994    Years since quitting: 28.4   Smokeless tobacco: Never  Substance and Sexual Activity   Alcohol use: Yes    Alcohol/week: 5.0 standard drinks of alcohol    Types: 5 Glasses of wine per week    Comment: 1 glass per day   Drug use: No   Sexual activity: Yes    Partners: Male    Birth control/protection: Surgical    Comment: Married  Other  Topics Concern   Not on file  Social History Narrative   Lives at home w/ her husband and 2 children   Right-handed   Caffeine: 2 cups per day   Social Determinants of Health   Financial Resource Strain: Low Risk  (06/25/2022)   Overall Financial Resource Strain (CARDIA)    Difficulty of Paying Living Expenses: Not very hard  Food Insecurity: Unknown (06/25/2022)   Hunger Vital Sign    Worried About Running Out of Food in the Last Year: Patient refused    Ran Out of Food in the Last Year: Patient refused  Transportation Needs: No Transportation Needs (06/25/2022)   PRAPARE - Administrator, Civil Service (Medical): No    Lack of Transportation (Non-Medical): No  Physical Activity: Insufficiently Active (06/25/2022)   Exercise Vital Sign    Days of Exercise per Week: 3 days    Minutes of Exercise per Session: 10 min  Stress: Stress Concern Present (06/25/2022)   [employer] of Occupational Health - Occupational Stress Questionnaire  Feeling of Stress : Rather much  Social Connections: Unknown (06/25/2022)   Social Connection and Isolation Panel [NHANES]    Frequency of Communication with Friends and Family: Twice a week    Frequency of Social Gatherings with Friends and Family: Once a week    Attends Religious Services: Patient refused    Marine scientist or Organizations: No    Attends Archivist Meetings: Not on file    Marital Status: Married   Vitals:   09/05/22 0919  BP: 126/80  Pulse: 76  Resp: 12  Temp: 97.8 F (36.6 C)  SpO2: 98%   Wt Readings from Last 3 Encounters:  09/05/22 156 lb (70.8 kg)  06/25/22 149 lb 2 oz (67.6 kg)  03/23/22 152 lb (68.9 kg)   Body mass index is 25.18 kg/m.  Physical Exam Vitals and nursing note reviewed.  Constitutional:      General: She is not in acute distress.    Appearance: She is well-developed.  HENT:     Head: Normocephalic and atraumatic.     Mouth/Throat:     Mouth: Mucous  membranes are moist.     Pharynx: Oropharynx is clear.  Eyes:     Conjunctiva/sclera: Conjunctivae normal.  Cardiovascular:     Rate and Rhythm: Normal rate and regular rhythm.     Pulses:          Dorsalis pedis pulses are 2+ on the right side and 2+ on the left side.     Heart sounds: No murmur heard.    Comments: Pedal edema and trace pitting LE edema, bilateral. Pulmonary:     Effort: Pulmonary effort is normal. No respiratory distress.     Breath sounds: Normal breath sounds.  Abdominal:     Palpations: Abdomen is soft. There is no hepatomegaly or mass.     Tenderness: There is no abdominal tenderness.  Musculoskeletal:     Comments: Positive trigger points.  Lymphadenopathy:     Cervical: No cervical adenopathy.  Skin:    General: Skin is warm.     Findings: No erythema or rash.     Comments: Erythematous scaly areas on posterior ankles and toes. No local heat or induration.   Neurological:     General: No focal deficit present.     Mental Status: She is alert and oriented to person, place, and time.     Cranial Nerves: No cranial nerve deficit.     Gait: Gait normal.  Psychiatric:        Mood and Affect: Mood is anxious.   ASSESSMENT AND PLAN:  Sandy Brennan was seen today for medical management of chronic issues.  Diagnoses and all orders for this visit:  Orders Placed This Encounter  Procedures   CBC   Comprehensive metabolic panel   C-reactive protein   Ambulatory referral to Pain Clinic   Lab Results  Component Value Date   WBC 6.0 09/05/2022   HGB 13.2 09/05/2022   HCT 38.4 09/05/2022   MCV 95.7 09/05/2022   PLT 247.0 09/05/2022   Lab Results  Component Value Date   CREATININE 0.85 09/05/2022   BUN 15 09/05/2022   NA 135 09/05/2022   K 3.9 09/05/2022   CL 98 09/05/2022   CO2 30 09/05/2022   Lab Results  Component Value Date   ALT 19 09/05/2022   AST 23 09/05/2022   ALKPHOS 59 09/05/2022   BILITOT 0.6 09/05/2022   Lab Results  Component  Value Date  CRP <1.0 09/05/2022    Bilateral lower extremity edema Stable. We discussed possible causes. Continue triamterene-HCTZ 75-50 mg daily. I still think vein disease can be a contributing factor, she has not tolerated compression stocking, aggravate pain. Continue appropriate skin care.  Disturbance of skin sensation No clear etiology at this time. Fibromyalgia is in the differential. Extensive work up that included spinal tap with CSF analysis and negative otherwise. She has not tolerated Lyrica, gabapentin,Nortriptyline, and Duloxetine. She doe snot want to try any of these meds again. Still pending appointment with neurologist at [facility].  Insomnia Problem is not well controlled but stable. Has tried other medications including Seroquel. Continue Lunesta 3 mg at bedtime as needed, which is the one that has helped the most. Good sleep hygiene is also recommended.  Dysphagia Just when swallowing pills, she is reporting no problems with swallowing solids or liquids. I think we can hold on further work up for now, will consider swallowing studies or GI evaluation if problem gets worse. If needed she can split tabs.  Chronic pain syndrome Generalized myalgias and arthralgias. Possible etiologies discussed. Otherwise negative rheumatologic work up except for positive ANA's. She has seems rheumatologist and Plaquenil was recommend ed but she discontinued due to side effects. Tramadol helps some, continue 50 mg daily at bedtime. She agrees with phys rehab medicine consultation.  Chest pain She has been evaluated by cardiologist in the past. Hx does not suggest a serious cardiac process.  ?GI or musculoskeletal causes. She agrees with holding on cardio referral at this time. Echo, cardiac monitor,and carotid duplex in 07/2025 with no significant abnormalities, rare PAC's seen while wearing monitor. Instructed about warning signs.  I spent a total of 41 minutes in both face  to face and non face to face activities for this visit on the date of this encounter. During this time history was obtained and documented, examination was performed, prior labs/imaging reviewed, and assessment/plan discussed. Return in about 5 months (around 02/03/2023).  Braelee Herrle G. Martin[place], MD  [facility]. [place] office.

## 2022-09-05 ENCOUNTER — Ambulatory Visit (INDEPENDENT_AMBULATORY_CARE_PROVIDER_SITE_OTHER): Payer: PRIVATE HEALTH INSURANCE | Admitting: Family Medicine

## 2022-09-05 ENCOUNTER — Encounter: Payer: Self-pay | Admitting: Family Medicine

## 2022-09-05 VITALS — BP 126/80 | HR 76 | Temp 97.8°F | Resp 12 | Ht 66.0 in | Wt 156.0 lb

## 2022-09-05 DIAGNOSIS — R209 Unspecified disturbances of skin sensation: Secondary | ICD-10-CM

## 2022-09-05 DIAGNOSIS — R600 Localized edema: Secondary | ICD-10-CM

## 2022-09-05 DIAGNOSIS — G4700 Insomnia, unspecified: Secondary | ICD-10-CM | POA: Diagnosis not present

## 2022-09-05 DIAGNOSIS — R1310 Dysphagia, unspecified: Secondary | ICD-10-CM

## 2022-09-05 DIAGNOSIS — G894 Chronic pain syndrome: Secondary | ICD-10-CM

## 2022-09-05 DIAGNOSIS — R079 Chest pain, unspecified: Secondary | ICD-10-CM

## 2022-09-05 LAB — COMPREHENSIVE METABOLIC PANEL
ALT: 19 U/L (ref 0–35)
AST: 23 U/L (ref 0–37)
Albumin: 4.7 g/dL (ref 3.5–5.2)
Alkaline Phosphatase: 59 U/L (ref 39–117)
BUN: 15 mg/dL (ref 6–23)
CO2: 30 mEq/L (ref 19–32)
Calcium: 9.2 mg/dL (ref 8.4–10.5)
Chloride: 98 mEq/L (ref 96–112)
Creatinine, Ser: 0.85 mg/dL (ref 0.40–1.20)
GFR: 78.72 mL/min (ref >60.00)
Glucose, Bld: 93 mg/dL (ref 70–99)
Potassium: 3.9 mEq/L (ref 3.5–5.1)
Sodium: 135 mEq/L (ref 135–145)
Total Bilirubin: 0.6 mg/dL (ref 0.2–1.2)
Total Protein: 7.5 g/dL (ref 6.0–8.3)

## 2022-09-05 LAB — CBC
HCT: 38.4 % (ref 36.0–46.0)
Hemoglobin: 13.2 g/dL (ref 12.0–15.0)
MCHC: 34.4 g/dL (ref 30.0–36.0)
MCV: 95.7 fl (ref 78.0–100.0)
Platelets: 247 10*3/uL (ref 150.0–400.0)
RBC: 4.02 Mil/uL (ref 3.87–5.11)
RDW: 13.1 % (ref 11.5–15.5)
WBC: 6 10*3/uL (ref 4.0–10.5)

## 2022-09-05 LAB — C-REACTIVE PROTEIN: CRP: <1 mg/dL (ref 0.5–20.0)

## 2022-09-05 NOTE — Assessment & Plan Note (Signed)
No clear etiology at this time. Fibromyalgia is in the differential. Extensive work up that included spinal tap with CSF analysis and negative otherwise. She has not tolerated Lyrica, gabapentin,Nortriptyline, and Duloxetine. She doe snot want to try any of these meds again. Still pending appointment with neurologist at [facility].

## 2022-09-05 NOTE — Patient Instructions (Signed)
A few things to remember from today's visit:  Bilateral lower extremity edema  Disturbance of skin sensation - Plan: CBC, Comprehensive metabolic panel, C-reactive protein  Insomnia, unspecified type  Chronic pain syndrome - Plan: Ambulatory referral to Pain Clinic  No changes today. Pending appt with neurologist. I placed a referral to rehab medicine.  If you need refills for medications you take chronically, please call your pharmacy. Do not use My Chart to request refills or for acute issues that need immediate attention. If you send a my chart message, it may take a few days to be addressed, specially if I am not in the office.  Please be sure medication list is accurate. If a new problem present, please set up appointment sooner than planned today.

## 2022-09-05 NOTE — Progress Notes (Signed)
Office Visit Note  Patient: Sandy Brennan             Date of Birth: 19-Apr-1971           MRN: [id number]             PCP: Martinique, Betty G, MD Referring: Rockne Menghini, NP Visit Date: 09/19/2022 Occupation: @GUAROCC$ @  Subjective:  Positive ANA and pain in multiple joints  History of Present Illness: Sandy Brennan is a 52 y.o. female seen in consultation per request of her PCP.  According the patient her symptoms started in 2014 with heat sensation in her abdominal region.  She states she was also having some neck a stiffness and had some neck adjustment by her PCP.  2 weeks later she started having pain all over and hot sensation everywhere on her body.  She also noticed rash on her extremities and swelling on her feet.  She was seen by Dr.Semble (rheumatologist) at [facility] who did a thorough workup and came with the diagnosis of possible fibromyalgia and connective tissue disease.  She was tried on Lyrica, gabapentin and muscle relaxers without much help.  She states she dealt with her symptoms for several years and in 2019 she went to see Dr. Posey Pronto for second opinion.  He did labs which showed positive ANA.  She was given hydroxychloroquine for 3 months without any improvement and then Imuran for 3 months without any improvement.  She stopped both medications.  She states she suffered from progressive pain and discomfort in her legs, lower back, chest pain, constipation and hair loss.  She states her insomnia got worse over time.  In March 2023 she started having tightness in the midsection, discomfort in her neck, lower back and blistering rash.  She also started experiencing numbness all over her body and her both legs.  She states she was experiencing headaches, nausea, vomiting, insomnia and frequent falls.  She was seen by neurologist in [place] who did extensive workup including MRI of her brain, cervical and lumbar spine.  She was told that she had mild degenerative disc  disease.  She also had heavy metal screening and a spinal tap.  She states that the workup for MS was negative.  She was seen by a gastroenterologist and had colonoscopy in 2022 which was negative.  She also had endoscopy in 2023 in [place] according to the patient she was told that she had inflammation in her gut and was given medications which were not effective.  She had been seeing [facility] dermatology and was diagnosed with eczema.  She has been using topical triamcinolone.  She also tried Xyzal and hydroxyzine which helped her rash.  She has an appointment scheduled in March 2024 with pain management.  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  She comes today as she wants further workup for history of positive ANA and positive rheumatoid factor.  She gives history of sicca symptoms, hair loss, photosensitivity, joint pain and joint swelling.  She notices swelling in her hands and her hips.  She gives history of generalized pain in all of her extremities and trunk.  She also experiences numbness in her  extremities.  She states her daughter has been recently diagnosed with Wilson's disease.  There is no family history of autoimmune disease.  She is gravida 2, para 2.  There is no history of DVTs.    Activities of Daily Living:  Patient reports morning stiffness  for constant .   Patient Reports nocturnal pain.  Difficulty dressing/grooming: Denies Difficulty climbing stairs: Denies Difficulty getting out of chair: Reports Difficulty using hands for taps, buttons, cutlery, and/or writing: Denies  Review of Systems  Constitutional:  Positive for fatigue.  HENT:  Positive for mouth dryness. Negative for mouth sores and nosebleeds.   Eyes:  Positive for dryness.  Respiratory:  Positive for shortness of breath.   Cardiovascular:  Positive for chest pain and palpitations.  Gastrointestinal:  Positive for constipation. Negative for blood in stool and diarrhea.   Endocrine: Negative.  Negative for increased urination.  Genitourinary: Negative.  Negative for involuntary urination.  Musculoskeletal:  Positive for joint pain, gait problem, joint pain, joint swelling, myalgias, muscle weakness, morning stiffness, muscle tenderness and myalgias.  Skin:  Positive for color change, rash, hair loss and sensitivity to sunlight.  Allergic/Immunologic: Positive for susceptible to infections.  Neurological:  Positive for dizziness and headaches.  Hematological: Negative.  Negative for swollen glands.  Psychiatric/Behavioral:  Positive for sleep disturbance. Negative for depressed mood. The patient is nervous/anxious.     PMFS History:  Patient Active Problem List   Diagnosis Date Noted   Dysphagia 09/05/2022   Insomnia 06/25/2022   Chronic pain syndrome 06/25/2022   Numbness 10/28/2021   Palpitation 08/04/2015   Bilateral lower extremity edema 08/04/2015   Chest pain 08/04/2015   Disturbance of skin sensation 03/27/2013    Past Medical History:  Diagnosis Date   Allergy 1973   Arthritis 2015   Connective tissue disorder (HCC)    Disturbance of skin sensation 03/27/2013   GERD (gastroesophageal reflux disease) 2023   Graves disease    Currently hypothyroid   H/O radioactive iodine thyroid ablation    Hypertension 2005   Migraine without aura, without mention of intractable migraine without mention of status migrainosus    Neuromuscular disorder ([place]) 2014   Vitamin B12 deficiency     Family History  Problem Relation Age of Onset   Cancer Mother        Breast cancer   AAA (abdominal aortic aneurysm) Mother    Arthritis Mother    Colon cancer Father    Cancer Father    Early death Father    Healthy Sister    Diabetes Maternal Grandmother    Ovarian cancer Maternal Grandmother    Lung cancer Paternal Grandmother    Lung cancer Paternal Grandfather    Intellectual disability Son    Past Surgical History:  Procedure Laterality Date    CESAREAN SECTION  1996, 2000   2 surgeries   TONSILLECTOMY  1977   TUBAL LIGATION Bilateral 2000   Social History   Social History Narrative   Lives at home w/ her husband and 2 children   Right-handed   Caffeine: 2 cups per day   Immunization History  Administered Date(s) Administered   PFIZER(Purple Top)SARS-COV-2 Vaccination 10/22/2019, 11/13/2019, 05/30/2020   Tdap 07/30/2016     Objective: Vital Signs: BP 132/88 (BP Location: Right Arm, Patient Position: Sitting, Cuff Size: Normal)   Pulse 69   Resp 16   Ht 5' 5"$  (1.651 m)   Wt 154 lb 3.2 oz (69.9 kg)   BMI 25.66 kg/m    Physical Exam Vitals and nursing note reviewed.  Constitutional:      Appearance: She is well-developed.  HENT:     Head: Normocephalic and atraumatic.  Eyes:     Conjunctiva/sclera: Conjunctivae normal.  Cardiovascular:     Rate and Rhythm:  Normal rate and regular rhythm.     Heart sounds: Normal heart sounds.  Pulmonary:     Effort: Pulmonary effort is normal.     Breath sounds: Normal breath sounds.  Abdominal:     General: Bowel sounds are normal.     Palpations: Abdomen is soft.  Musculoskeletal:     Cervical back: Normal range of motion.  Lymphadenopathy:     Cervical: No cervical adenopathy.  Skin:    General: Skin is warm and dry.     Capillary Refill: Capillary refill takes less than 2 seconds.     Comments: Her hands and feet were cool to touch with some hyperemia.  No Telengectesia or nailbed capillary changes were noted.  No sclerodactyly was noted.  Neurological:     Mental Status: She is alert and oriented to person, place, and time.  Psychiatric:        Behavior: Behavior normal.      Musculoskeletal Exam: She had limited lateral rotation of the cervical spine.  She had bilateral trapezius spasm.  Thoracic and lumbar spine were in good range of motion without any tenderness.  There was no tenderness over SI joints.  Shoulder joints, elbow joints, wrist joints, MCPs PIPs  and DIPs been good range of motion with no synovitis.  Hip joints and knee joints were in good range of motion without any warmth swelling or effusion.  There was no tenderness over ankles or MTPs.  No synovitis was noted.  She had tenderness over bilateral trochanteric bursa.  She had generalized hyperalgesia.  CDAI Exam: CDAI Score: -- Patient Global: --; Provider Global: -- Swollen: --; Tender: -- Joint Exam 09/19/2022   No joint exam has been documented for this visit   There is currently no information documented on the homunculus. Go to the Rheumatology activity and complete the homunculus joint exam.  Investigation: No additional findings.  Imaging: No results found.  Recent Labs: Lab Results  Component Value Date   WBC 6.0 09/05/2022   HGB 13.2 09/05/2022   PLT 247.0 09/05/2022   NA 135 09/05/2022   K 3.9 09/05/2022   CL 98 09/05/2022   CO2 30 09/05/2022   GLUCOSE 93 09/05/2022   BUN 15 09/05/2022   CREATININE 0.85 09/05/2022   BILITOT 0.6 09/05/2022   ALKPHOS 59 09/05/2022   AST 23 09/05/2022   ALT 19 09/05/2022   PROT 7.5 09/05/2022   ALBUMIN 4.7 09/05/2022   CALCIUM 9.2 09/05/2022   GFRAA 102 04/18/2016    Speciality Comments: No specialty comments available.  Procedures:  No procedures performed Allergies: Codeine, Erythromycin, Penicillins, and Prednisone   Assessment / Plan:     Visit Diagnoses: Polyarthralgia- Dr. Posey Pronto 2019. Dr. Doug Sou with fibromyalgia and connective tissue disease-treated with lyrica, neurotin, muscle relaxers. -Patient gives history of pain in multiple joints since 2014.  She describes discomfort in her cervical spine, lumbar spine, bilateral hands and her feet.  She states she had positive ANA for many years.  She was initially evaluated at [facility] and was given the diagnosis of fibromyalgia.  She was later evaluated by Dr. 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  She tried Imuran for 3  months without any improvement and Plaquenil for 3 months without any improvement.  She continues to have pain and discomfort in multiple joints.  No synovitis was noted on the examination.  Patient states her feet become swollen towards the end of the day.  She sits  all day at the desk as she does did her research and also attending online business school.  Plan: Rheumatoid factor, Cyclic citrul peptide antibody, IgG, Sedimentation rate  Pain in both hands -she complains of discomfort in her bilateral hands.  No synovitis was noted on the examination today.  Plan: XR Hand 2 View Right, XR Hand 2 View Left.  X-rays showed CMC and PIP narrowing bilaterally.  Pain in both feet -she complains of disc for the bilateral feet.  No synovitis was noted.  Plan: XR Foot 2 Views Right, XR Foot 2 Views Left.  X-rays showed right first MTP narrowing otherwise unremarkable.  Rheumatoid factor positive-I will recheck labs today.  High risk medication use -patient was treated with Plaquenil for 3 months and Imuran for 3 months in 2019 by Dr. Posey Pronto.  Patient reported no improvement on the medications and the medications were discontinued.  and Naltrexone 2019  Positive ANA (antinuclear antibody) - positive ANA, paresthesia, parotid swelling, photosensitive rash, intermittent rashes-concern for UCTD-"all manifestations did not add up" per Dr. Posey Pronto - Patient gives history of sicca symptoms,, joint pain, joint swelling, Raynaud's, hair loss, frequent rashes and photosensitivity.  I will obtain autoimmune labs today.  Plan: Protein / creatinine ratio, urine, CK, ANA, Anti-scleroderma antibody, RNP Antibody, Anti-Smith antibody, Sjogrens syndrome-A extractable nuclear antibody, Sjogrens syndrome-B extractable nuclear antibody, Anti-DNA antibody, double-stranded, C3 and C4, Beta-2 glycoprotein antibodies, Cardiolipin antibodies, IgG, IgM, IgA, Lupus Anticoagulant Eval w/Reflex  Raynauds phenomenon-patient gives history of  Raynaud's phenomenon for many years.  She reports whitish discoloration of her extremities.  She had good capillary refill with no Telengectesia, sclerodactyly or nailbed capillary changes.  Her hands and feet cool to touch.  Fibromyalgia -she was diagnosed with fibromyalgia syndrome many years ago at [facility] in 2014 by rheumatologist.  She tried lyrica, gabapentin, muscle relaxers, elavil without much relief.  She continues to have generalized pain and discomfort.  She describes pain and discomfort at in her extremities and entire trunk.  She also has neurologist.  She has had extensive workup by neurologist in the past.  Neuralgia-patient reports that she had extensive workup in [place] by her neurologist including MRI of her brain, cervical spine, lumbar spine MRI, heavy metal screen and CSF analysis to rule out MS.  She also had nerve conduction velocities and EMG.  According to patient the whole workup was inconclusive.  She continues to have numbness in her lower extremities.  Chronic pain syndrome-she has chronic pain syndrome.  She has an appointment coming up with the pain management in March.  Rash-she brought several pictures of rashes on her extremities and trunk.  Patient states she has been followed by [facility] dermatology and was given topical triamcinolone, hydroxyzine and Xyzal which helped her symptoms.  She did not have any rash on the examination today.  Bilateral lower extremity edema-she gives history of edema in her bilateral lower extremity towards the end of the day.  Other insomnia-she has chronic insomnia.  Palpitation-patient reports having extensive cardiology workup which was negative.  Abdominal discomfort-patient reports having colonoscopy in 2022 and endoscopy in 2023.  She states she had some inflammation in her stomach.  She was given medications which were not effective.  Acquired hypothyroidism-TSH was normal on March 23, 2022.  Orders: Orders Placed This Encounter  Procedures   XR Hand 2 View Right   XR Hand 2 View Left   XR Foot 2 Views Right   XR Foot 2 Views Left  Protein / creatinine ratio, urine   Rheumatoid factor   Cyclic citrul peptide antibody, IgG   Sedimentation rate   CK   ANA   Anti-scleroderma antibody   RNP Antibody   Anti-Smith antibody   Sjogrens syndrome-A extractable nuclear antibody   Sjogrens syndrome-B extractable nuclear antibody   Anti-DNA antibody, double-stranded   C3 and C4   Beta-2 glycoprotein antibodies   Cardiolipin antibodies, IgG, IgM, IgA   Lupus Anticoagulant Eval w/Reflex   No orders of the defined types were placed in this encounter.    Follow-Up Instructions: Return for Pain in multiple joints, positive ANA.   Bo Merino, MD  Note - This record has been created using Editor, commissioning.  Chart creation errors have been sought, but may not always  have been located. Such creation errors do not reflect on  the standard of medical care.,

## 2022-09-05 NOTE — Assessment & Plan Note (Signed)
Problem is not well controlled but stable. Has tried other medications including Seroquel. Continue Lunesta 3 mg at bedtime as needed, which is the one that has helped the most. Good sleep hygiene is also recommended.

## 2022-09-05 NOTE — Assessment & Plan Note (Signed)
Stable. We discussed possible causes. Continue triamterene-HCTZ 75-50 mg daily. I still think vein disease can be a contributing factor, she has not tolerated compression stocking, aggravate pain. Continue appropriate skin care.

## 2022-09-06 ENCOUNTER — Encounter: Payer: Self-pay | Admitting: Family Medicine

## 2022-09-06 NOTE — Assessment & Plan Note (Signed)
Just when swallowing pills, she is reporting no problems with swallowing solids or liquids. I think we can hold on further work up for now, will consider swallowing studies or GI evaluation if problem gets worse. If needed she can split tabs.

## 2022-09-06 NOTE — Assessment & Plan Note (Signed)
Generalized myalgias and arthralgias. Possible etiologies discussed. Otherwise negative rheumatologic work up except for positive ANA's. She has seems rheumatologist and Plaquenil was recommend ed but she discontinued due to side effects. Tramadol helps some, continue 50 mg daily at bedtime. She agrees with phys rehab medicine consultation.

## 2022-09-06 NOTE — Assessment & Plan Note (Signed)
She has been evaluated by cardiologist in the past. Hx does not suggest a serious cardiac process.  ?GI or musculoskeletal causes. She agrees with holding on cardio referral at this time. Echo, cardiac monitor,and carotid duplex in 07/2025 with no significant abnormalities, rare PAC's seen while wearing monitor. Instructed about warning signs.

## 2022-09-12 ENCOUNTER — Ambulatory Visit: Payer: PRIVATE HEALTH INSURANCE | Admitting: Family Medicine

## 2022-09-13 ENCOUNTER — Encounter: Payer: Self-pay | Admitting: Physical Medicine and Rehabilitation

## 2022-09-19 ENCOUNTER — Encounter: Payer: Self-pay | Admitting: Rheumatology

## 2022-09-19 ENCOUNTER — Ambulatory Visit: Payer: PRIVATE HEALTH INSURANCE | Attending: Rheumatology | Admitting: Rheumatology

## 2022-09-19 ENCOUNTER — Ambulatory Visit: Payer: PRIVATE HEALTH INSURANCE

## 2022-09-19 ENCOUNTER — Ambulatory Visit (INDEPENDENT_AMBULATORY_CARE_PROVIDER_SITE_OTHER): Payer: PRIVATE HEALTH INSURANCE

## 2022-09-19 VITALS — BP 132/88 | HR 69 | Resp 16 | Ht 65.0 in | Wt 154.2 lb

## 2022-09-19 DIAGNOSIS — M130 Polyarthritis, unspecified: Secondary | ICD-10-CM

## 2022-09-19 DIAGNOSIS — M2550 Pain in unspecified joint: Secondary | ICD-10-CM

## 2022-09-19 DIAGNOSIS — E039 Hypothyroidism, unspecified: Secondary | ICD-10-CM

## 2022-09-19 DIAGNOSIS — M79672 Pain in left foot: Secondary | ICD-10-CM

## 2022-09-19 DIAGNOSIS — M79671 Pain in right foot: Secondary | ICD-10-CM

## 2022-09-19 DIAGNOSIS — Z79899 Other long term (current) drug therapy: Secondary | ICD-10-CM

## 2022-09-19 DIAGNOSIS — M79642 Pain in left hand: Secondary | ICD-10-CM | POA: Diagnosis not present

## 2022-09-19 DIAGNOSIS — R600 Localized edema: Secondary | ICD-10-CM

## 2022-09-19 DIAGNOSIS — R768 Other specified abnormal immunological findings in serum: Secondary | ICD-10-CM | POA: Diagnosis not present

## 2022-09-19 DIAGNOSIS — R002 Palpitations: Secondary | ICD-10-CM

## 2022-09-19 DIAGNOSIS — R21 Rash and other nonspecific skin eruption: Secondary | ICD-10-CM

## 2022-09-19 DIAGNOSIS — R109 Unspecified abdominal pain: Secondary | ICD-10-CM

## 2022-09-19 DIAGNOSIS — M79641 Pain in right hand: Secondary | ICD-10-CM

## 2022-09-19 DIAGNOSIS — M797 Fibromyalgia: Secondary | ICD-10-CM

## 2022-09-19 DIAGNOSIS — G4709 Other insomnia: Secondary | ICD-10-CM

## 2022-09-19 DIAGNOSIS — M792 Neuralgia and neuritis, unspecified: Secondary | ICD-10-CM

## 2022-09-19 DIAGNOSIS — I7300 Raynaud's syndrome without gangrene: Secondary | ICD-10-CM

## 2022-09-19 DIAGNOSIS — G894 Chronic pain syndrome: Secondary | ICD-10-CM

## 2022-09-21 ENCOUNTER — Encounter: Payer: Self-pay | Admitting: Family Medicine

## 2022-09-26 LAB — BETA-2 GLYCOPROTEIN ANTIBODIES
Beta-2 Glyco 1 IgA: <2 U/mL (ref <20.0)
Beta-2 Glyco 1 IgM: <2 U/mL (ref <20.0)
Beta-2 Glyco I IgG: <2 U/mL (ref <20.0)

## 2022-09-26 LAB — PROTEIN / CREATININE RATIO, URINE
Creatinine, Urine: 66 mg/dL (ref 20–275)
Protein/Creat Ratio: 106 mg/g creat (ref 24–184)
Protein/Creatinine Ratio: 0.106 mg/mg creat (ref 0.024–0.184)
Total Protein, Urine: 7 mg/dL (ref 5–24)

## 2022-09-26 LAB — C3 AND C4
C3 Complement: 117 mg/dL (ref 83–193)
C4 Complement: 25 mg/dL (ref 15–57)

## 2022-09-26 LAB — LUPUS ANTICOAGULANT EVAL W/ REFLEX
PTT-LA Screen: 32 s (ref <=40)
dRVVT: 33 s (ref <=45)

## 2022-09-26 LAB — ANA: Anti Nuclear Antibody (ANA): POSITIVE — AB

## 2022-09-26 LAB — CK: Total CK: 52 U/L (ref 29–143)

## 2022-09-26 LAB — ANTI-NUCLEAR AB-TITER (ANA TITER): ANA Titer 1: 1:80 {titer} — ABNORMAL HIGH

## 2022-09-26 LAB — ANTI-SCLERODERMA ANTIBODY: Scleroderma (Scl-70) (ENA) Antibody, IgG: <1 AI

## 2022-09-26 LAB — CARDIOLIPIN ANTIBODIES, IGG, IGM, IGA
Anticardiolipin IgA: <2 APL-U/mL (ref <20.0)
Anticardiolipin IgG: <2 GPL-U/mL (ref <20.0)
Anticardiolipin IgM: <2 MPL-U/mL (ref <20.0)

## 2022-09-26 LAB — RHEUMATOID FACTOR: Rheumatoid fact SerPl-aCnc: 23 IU/mL — ABNORMAL HIGH (ref <14)

## 2022-09-26 LAB — SJOGRENS SYNDROME-B EXTRACTABLE NUCLEAR ANTIBODY: SSB (La) (ENA) Antibody, IgG: <1 AI

## 2022-09-26 LAB — SJOGRENS SYNDROME-A EXTRACTABLE NUCLEAR ANTIBODY: SSA (Ro) (ENA) Antibody, IgG: <1 AI

## 2022-09-26 LAB — ANTI-DNA ANTIBODY, DOUBLE-STRANDED: ds DNA Ab: 1 IU/mL

## 2022-09-26 LAB — ANTI-SMITH ANTIBODY: ENA SM Ab Ser-aCnc: <1 AI

## 2022-09-26 LAB — SEDIMENTATION RATE: Sed Rate: 2 mm/h (ref 0–30)

## 2022-09-26 LAB — CYCLIC CITRUL PEPTIDE ANTIBODY, IGG: Cyclic Citrullin Peptide Ab: <16 UNITS

## 2022-09-26 LAB — RNP ANTIBODY: Ribonucleic Protein(ENA) Antibody, IgG: <1 AI

## 2022-09-26 NOTE — Progress Notes (Deleted)
Office Visit Note  Patient: Sandy Brennan             Date of Birth: 1971/06/16           MRN: [id number]             PCP: Martinique, Betty G, MD Referring: Rockne Menghini, NP Visit Date: 10/10/2022 Occupation: '@GUAROCC'$ @  Subjective:  No chief complaint on file.   History of Present Illness: Sandy Brennan is a 52 y.o. female ***     Activities of Daily Living:  Patient reports morning stiffness for *** {minute/hour:19697}.   Patient {ACTIONS;DENIES/REPORTS:21021675::"Denies"} nocturnal pain.  Difficulty dressing/grooming: {ACTIONS;DENIES/REPORTS:21021675::"Denies"} Difficulty climbing stairs: {ACTIONS;DENIES/REPORTS:21021675::"Denies"} Difficulty getting out of chair: {ACTIONS;DENIES/REPORTS:21021675::"Denies"} Difficulty using hands for taps, buttons, cutlery, and/or writing: {ACTIONS;DENIES/REPORTS:21021675::"Denies"}  No Rheumatology ROS completed.   PMFS History:  Patient Active Problem List   Diagnosis Date Noted   Dysphagia 09/05/2022   Insomnia 06/25/2022   Chronic pain syndrome 06/25/2022   Numbness 10/28/2021   Palpitation 08/04/2015   Bilateral lower extremity edema 08/04/2015   Chest pain 08/04/2015   Disturbance of skin sensation 03/27/2013    Past Medical History:  Diagnosis Date   Allergy 1973   Arthritis 2015   Connective tissue disorder (HCC)    Disturbance of skin sensation 03/27/2013   GERD (gastroesophageal reflux disease) 2023   Graves disease    Currently hypothyroid   H/O radioactive iodine thyroid ablation    Hypertension 2005   Migraine without aura, without mention of intractable migraine without mention of status migrainosus    Neuromuscular disorder ([place]) 2014   Vitamin B12 deficiency     Family History  Problem Relation Age of Onset   Cancer Mother        Breast cancer   AAA (abdominal aortic aneurysm) Mother    Arthritis Mother    Colon cancer Father    Cancer Father    Early death Father    Healthy Sister    Diabetes Maternal  Grandmother    Ovarian cancer Maternal Grandmother    Lung cancer Paternal Grandmother    Lung cancer Paternal Grandfather    Intellectual disability Son    Past Surgical History:  Procedure Laterality Date   CESAREAN SECTION  1996, 2000   2 surgeries   TONSILLECTOMY  1977   TUBAL LIGATION Bilateral 2000   Social History   Social History Narrative   Lives at home w/ her husband and 2 children   Right-handed   Caffeine: 2 cups per day   Immunization History  Administered Date(s) Administered   PFIZER(Purple Top)SARS-COV-2 Vaccination 10/22/2019, 11/13/2019, 05/30/2020   Tdap 07/30/2016     Objective: Vital Signs: There were no vitals taken for this visit.   Physical Exam   Musculoskeletal Exam: ***  CDAI Exam: CDAI Score: -- Patient Global: --; Provider Global: -- Swollen: --; Tender: -- Joint Exam 10/10/2022   No joint exam has been documented for this visit   There is currently no information documented on the homunculus. Go to the Rheumatology activity and complete the homunculus joint exam.  Investigation: No additional findings.  Imaging: XR Foot 2 Views Left  Result Date: 09/19/2022 No MTP, PIP or DIP narrowing was noted.  No intertarsal, tibiotalar or subtalar joint space narrowing was noted.  No erosive changes were noted. Impression: Unremarkable x-rays of the foot.  XR Foot 2 Views Right  Result Date: 09/19/2022 First MTP narrowing and spurring was noted.  No PIP and DIP narrowing was noted.  No intertarsal, tibiotalar or subtalar joint space narrowing was noted.  No erosive changes were noted. Impression: Osteoarthritis of the right first MTP was noted.  XR Hand 2 View Left  Result Date: 09/19/2022 Mild CMC and PIP narrowing was noted.  No MCP, intercarpal or radiocarpal joint space narrowing was noted.  No erosive changes were noted. 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  XR Hand 2 View Right  Result  Date: 09/19/2022 Mild CMC and PIP narrowing was noted.  No MCP, intercarpal or radiocarpal joint space narrowing was noted.  No erosive changes were noted. 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   Recent Labs: Lab Results  Component Value Date   WBC 6.0 09/05/2022   HGB 13.2 09/05/2022   PLT 247.0 09/05/2022   NA 135 09/05/2022   K 3.9 09/05/2022   CL 98 09/05/2022   CO2 30 09/05/2022   GLUCOSE 93 09/05/2022   BUN 15 09/05/2022   CREATININE 0.85 09/05/2022   BILITOT 0.6 09/05/2022   ALKPHOS 59 09/05/2022   AST 23 09/05/2022   ALT 19 09/05/2022   PROT 7.5 09/05/2022   ALBUMIN 4.7 09/05/2022   CALCIUM 9.2 09/05/2022   GFRAA 102 04/18/2016   September 20, 2022 ANA 1: 80NS, ENA (SCL 70, RNP, Smith, SSA, SSB, dsDNA) negative, C3-C4 normal, anticardiolipin negative, beta-2 GP 1 negative, urine protein creatinine ratio normal, ESR 2, CK 52, RF 23, anti-CCP<16 Speciality Comments: No specialty comments available.  Procedures:  No procedures performed Allergies: Codeine, Erythromycin, Penicillins, and Prednisone   Assessment / Plan:     Visit Diagnoses: No diagnosis found.  Orders: No orders of the defined types were placed in this encounter.  No orders of the defined types were placed in this encounter.   Face-to-face time spent with patient was *** minutes. Greater than 50% of time was spent in counseling and coordination of care.  Follow-Up Instructions: No follow-ups on file.   Bo Merino, MD  Note - This record has been created using Editor, commissioning.  Chart creation errors have been sought, but may not always  have been located. Such creation errors do not reflect on  the standard of medical care.

## 2022-09-26 NOTE — Progress Notes (Signed)
I will discuss results at the follow-up visit.  If patient is experiencing pain in her hands please schedule ultrasound of bilateral hands to look for synovitis.

## 2022-09-27 ENCOUNTER — Encounter: Payer: Self-pay | Admitting: Rheumatology

## 2022-09-27 ENCOUNTER — Telehealth: Payer: Self-pay | Admitting: Rheumatology

## 2022-09-27 NOTE — Telephone Encounter (Signed)
I returned patient's call and discussed lab results with her.  She has low titer positive ANA which is not significant.  ENA panel is negative.  Rheumatoid factor was low titer positive at 23.  I advised patient if she has hand pain then we can schedule ultrasound to evaluate for synovitis.  Patient would like to cancel her new patient follow-up visit at this point.  She stated that she will contact us if she develops swelling.

## 2022-09-27 NOTE — Telephone Encounter (Signed)
Called patient to schedule ultrasound.  Patient states her daughter needs a liver transplant and she is the donor so she will have to hold off on her medical appointments at this time.  Patient requested a return call to discuss the results of her labwork

## 2022-09-27 NOTE — Telephone Encounter (Signed)
-----   Message from Shona Needles, RT sent at 09/26/2022  4:52 PM EST ----- Please call patient and schedule BIL Hand [place]. Thank you.  I called patient, patient is having bil hand pain and would like [place], I will discuss results at the follow-up visit. If patient is experiencing pain in her hands please schedule ultrasound of bilateral hands to look for synovitis.

## 2022-10-09 ENCOUNTER — Encounter: Payer: Self-pay | Admitting: Family Medicine

## 2022-10-10 ENCOUNTER — Ambulatory Visit: Payer: BLUE CROSS/BLUE SHIELD | Admitting: Rheumatology

## 2022-10-10 DIAGNOSIS — M2550 Pain in unspecified joint: Secondary | ICD-10-CM

## 2022-10-10 DIAGNOSIS — G894 Chronic pain syndrome: Secondary | ICD-10-CM

## 2022-10-10 DIAGNOSIS — E039 Hypothyroidism, unspecified: Secondary | ICD-10-CM

## 2022-10-10 DIAGNOSIS — G4709 Other insomnia: Secondary | ICD-10-CM

## 2022-10-10 DIAGNOSIS — M797 Fibromyalgia: Secondary | ICD-10-CM

## 2022-10-10 DIAGNOSIS — R002 Palpitations: Secondary | ICD-10-CM

## 2022-10-10 DIAGNOSIS — M79671 Pain in right foot: Secondary | ICD-10-CM

## 2022-10-10 DIAGNOSIS — Z79899 Other long term (current) drug therapy: Secondary | ICD-10-CM

## 2022-10-10 DIAGNOSIS — R21 Rash and other nonspecific skin eruption: Secondary | ICD-10-CM

## 2022-10-10 DIAGNOSIS — R600 Localized edema: Secondary | ICD-10-CM

## 2022-10-10 DIAGNOSIS — M79641 Pain in right hand: Secondary | ICD-10-CM

## 2022-10-10 DIAGNOSIS — I7300 Raynaud's syndrome without gangrene: Secondary | ICD-10-CM

## 2022-10-10 DIAGNOSIS — R768 Other specified abnormal immunological findings in serum: Secondary | ICD-10-CM

## 2022-10-10 DIAGNOSIS — M792 Neuralgia and neuritis, unspecified: Secondary | ICD-10-CM

## 2022-10-22 ENCOUNTER — Encounter
Payer: PRIVATE HEALTH INSURANCE | Attending: Physical Medicine and Rehabilitation | Admitting: Physical Medicine and Rehabilitation

## 2022-10-22 VITALS — BP 127/86 | HR 75 | Ht 65.0 in | Wt 152.4 lb

## 2022-10-22 DIAGNOSIS — M542 Cervicalgia: Secondary | ICD-10-CM

## 2022-10-22 DIAGNOSIS — G894 Chronic pain syndrome: Secondary | ICD-10-CM | POA: Diagnosis not present

## 2022-10-22 DIAGNOSIS — F5101 Primary insomnia: Secondary | ICD-10-CM | POA: Insufficient documentation

## 2022-10-22 DIAGNOSIS — R200 Anesthesia of skin: Secondary | ICD-10-CM | POA: Diagnosis not present

## 2022-10-22 DIAGNOSIS — R600 Localized edema: Secondary | ICD-10-CM | POA: Diagnosis not present

## 2022-10-22 DIAGNOSIS — F411 Generalized anxiety disorder: Secondary | ICD-10-CM | POA: Diagnosis not present

## 2022-10-22 DIAGNOSIS — R2689 Other abnormalities of gait and mobility: Secondary | ICD-10-CM | POA: Insufficient documentation

## 2022-10-22 NOTE — Progress Notes (Signed)
Subjective:    Patient ID: Sandy Brennan, female    DOB: [birth year], 52 y.o.   MRN: [id number]  HPI   Sandy Brennan is a 52 y.o. year old female  who  has a past medical history of Allergy (1973), Arthritis (2015), Connective tissue disorder ([place]), Disturbance of skin sensation (03/27/2013), GERD (gastroesophageal reflux disease) (2023), Graves disease, H/O radioactive iodine thyroid ablation, Hypertension (2005), Migraine without aura, without mention of intractable migraine without mention of status migrainosus, Neuromuscular disorder ([place]) (2014), and Vitamin B12 deficiency.   They are presenting to PM&R clinic as a new patient for pain management evaluation. They were referred by [place], Betty G, MD  for treatment of generalized pain.   Per their referral: Hx of generalized pain, numbness,edema. Has not tolerated Gabapentin,TCA,Lyrica,or Duloxetine. Has appt pending with a new neuro at [facility]. Extensive work up has been negative.   Per Dr. Estanislado Pandy 09/27/22: "She has low titer positive ANA which is not significant. ENA panel is negative. Rheumatoid factor was low titer positive at 23. I advised patient if she has hand pain then we can schedule ultrasound to evaluate for synovitis. "  Recent referral to [facility] for neurology; no appointment scheduled yet.   Source: Everywhere; worst spots on abdomen, R>L leg, and neck Inciting incident: Ongoing for 10 years; had HVLA of her neck and since then started having weird symptoms of heat in her midsection, and everything else sprung from that. Starting last march, she had increase pressure in her stomach and a crushing sensation. Things have gotten progressively worse over the last year.   Description of pain:   - Stomach: constricting tightness  - Legs: Burning pain, stinging and coldness in her feet, tearing and swelling sensation in her thigh +Numbness in her right lateral thigh. +worse at nighttime - Neck: Posterior tightness, feelings of everything  "swelling into my jawbone".   Exacerbating factors: laying flat, laying on side, sitting, standing, activity, rest, and heat. Exercise makes it much worse, makes her sick and makes inflammation feel worse.  Remitting factors: lying down, hot bath, and TENS unit Red flag symptoms: No red flags for back pain endorsed in Hx or ROS  Medications tried: Topical medications (mild effect) : Mildly effective voltaren (neck), biofreeze (neck and back), and frankincense on her feet  Nsaids (no effect) : mobic Tylenol  (no effect) :  Opiates  (moderate effect) : was on tramadol 50 mg, took at nighttime with some benefit but stopped it due to liver function concerns; she is being evaluated as a liver donor for her daughter Gabapentin / Lyrica  (no effect) : Gabapentin 2 years ago, BID. Tried Lyrica last year, BID, unsure of dosing. +Nausea TCAs  (no effect) : Elavil gave her headaches and dizziness SNRIs  (no effect) : Cymbalta gave her HA and nausea Other  (no effect) : Was on Plaquenil; made her extremely sick and was not effective.   - - Takes 2 benadryll and 2 ibuprofen to bring down inflammation once a day; helps with the neck; thinks it may be allergy/sinus related.   Other treatments: PT/OT  (never tried) :  Accupuncture/chiropractor/massage  (never tried) :  TENs unit (moderate effect) : Works well for her back when it is on, but doesn't last. Usually uses when she lays on the couch. Uses a shaitzu massager on her feet.  Injections (never tried) :  Surgery (never tried) :  Other (none).   Goals for pain control: Sleeps 1.5 hours per night;  walking on the treadmill to get strength back in her legs. Sit without a TENs unit on.   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 She lives with her husband and son (ASD); pets recently passed away. Recent stressor with daughter having liver cirrhosis, unknown cause, going for transplant. She does notice her symptoms get worse with  stress.   Has not been to a therapist in "awhile", since pain started.   Had EMG last August in [facility]; no findings, was told likely small fiber neuropathy.   Prior UDS results: No results found for: "LABOPIA", "COCAINSCRNUR", "LABBENZ", "AMPHETMU", "THCU", "LABBARB"    Pain Inventory Average Pain 8 Pain Right Now 8 My pain is constant, burning, dull, stabbing, tingling, and aching  In the last 24 hours, has pain interfered with the following? General activity 8 Relation with others 5 Enjoyment of life 8 What TIME of day is your pain at its worst? daytime, evening, and night Sleep (in general) Poor  Pain is worse with: walking, bending, sitting, inactivity, standing, and some activites Pain improves with: rest and TENS Relief from Meds: 2  walk with assistance how many minutes can you walk? 15 ability to climb steps?  yes do you drive?  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 I need assistance with the following:  household duties and shopping  weakness numbness tremor tingling trouble walking confusion anxiety loss of taste or smell  New pt  New pt    Family History  Problem Relation Age of Onset   Cancer Mother        Breast cancer   AAA (abdominal aortic aneurysm) Mother    Arthritis Mother    Colon cancer Father    Cancer Father    Early death Father    Healthy Sister    Diabetes Maternal Grandmother    Ovarian cancer Maternal Grandmother    Lung cancer Paternal Grandmother    Lung cancer Paternal Grandfather    Intellectual disability Son    Social History   Socioeconomic History   Marital status: Married    Spouse name: Not on file   Number of children: 2   Years of education: 2 yrs college   Highest education level: Associate degree: academic program  Occupational History   Occupation: Self-employed  Tobacco Use   Smoking status: Former    Types: Cigarettes    Quit date: 04/18/1994    Years since quitting: 28.5     Passive exposure: Past   Smokeless tobacco: Never  Vaping Use   Vaping Use: Never used  Substance and Sexual Activity   Alcohol use: Yes    Alcohol/week: 5.0 standard drinks of alcohol    Types: 5 Glasses of wine per week    Comment: 1 glass per day   Drug use: No   Sexual activity: Yes    Partners: Male    Birth control/protection: Surgical    Comment: Married  Other Topics Concern   Not on file  Social History Narrative   Lives at home w/ her husband and 2 children   Right-handed   Caffeine: 2 cups per day   Social Determinants of Health   Financial Resource Strain: Low Risk  (06/25/2022)   Overall Financial Resource Strain (CARDIA)    Difficulty of Paying Living Expenses: Not very hard  Food Insecurity: Patient Declined (06/25/2022)   Hunger Vital Sign    Worried About Running Out of Food in the Last  Year: Patient declined    Arboriculturist in the Last Year: Patient declined  Transportation Needs: No Transportation Needs (06/25/2022)   PRAPARE - Hydrologist (Medical): No    Lack of Transportation (Non-Medical): No  Physical Activity: Insufficiently Active (06/25/2022)   Exercise Vital Sign    Days of Exercise per Week: 3 days    Minutes of Exercise per Session: 10 min  Stress: Stress Concern Present (06/25/2022)   Marine    Feeling of Stress : Rather much  Social Connections: Unknown (06/25/2022)   Social Connection and Isolation Panel [NHANES]    Frequency of Communication with Friends and Family: Twice a week    Frequency of Social Gatherings with Friends and Family: Once a week    Attends Religious Services: Patient declined    Marine scientist or Organizations: No    Attends Music therapist: Not on file    Marital Status: Married   Past Surgical History:  Procedure Laterality Date   [place], 2000   2 surgeries    TONSILLECTOMY  1977   TUBAL LIGATION Bilateral 2000   Past Medical History:  Diagnosis Date   Allergy 1973   Arthritis 2015   Connective tissue disorder ([place])    Disturbance of skin sensation 03/27/2013   GERD (gastroesophageal reflux disease) 2023   Graves disease    Currently hypothyroid   H/O radioactive iodine thyroid ablation    Hypertension 2005   Migraine without aura, without mention of intractable migraine without mention of status migrainosus    Neuromuscular disorder ([place]) 2014   Vitamin B12 deficiency    BP 127/86   Pulse 75   Ht 5\' 5"  (1.651 m)   Wt 152 lb 6.4 oz (69.1 kg)   SpO2 98%   BMI 25.36 kg/m   Opioid Risk Score:   Fall Risk Score:  `1  Depression screen [facility] 2/9     10/22/2022   11:04 AM 09/05/2022    9:29 AM 06/25/2022    8:06 AM 03/23/2022    8:30 AM  Depression screen PHQ 2/9  Decreased Interest 0 0 0 0  Down, Depressed, Hopeless 0 0 0 0  PHQ - 2 Score 0 0 0 0  Altered sleeping 3 3 3 3   Tired, decreased energy 3 3 3 3   Change in appetite 0 3 0 0  Feeling bad or failure about yourself  0 1 0 0  Trouble concentrating 1 1 1 3   Moving slowly or fidgety/restless 3 1 2  0  Suicidal thoughts 0 0 0 0  PHQ-9 Score 10 12 9 9   Difficult doing work/chores Very difficult Somewhat difficult Not difficult at all Somewhat difficult     Review of Systems  Gastrointestinal:  Positive for constipation and nausea.  Musculoskeletal:        Pain all over   Neurological:  Positive for tremors, weakness and numbness.  Psychiatric/Behavioral:  The patient is nervous/anxious.   All other systems reviewed and are negative.     Objective:   Physical Exam   PE: Constitution: Appropriate appearance for age. No apparent distress.  Resp: No respiratory distress. No accessory muscle usage. on RA Cardio: Well perfused appearance. No peripheral edema. Abdomen: Nondistended. Nontender.   Psych: Appropriate mood and affect. Skin: No apparent lesions or  discolorations.  - Patient produces photos of feet at various conditions including with bilateral  erythema, unilateral erythema, and bullae of the dorsal surface in various healing stages. None apparent on today's exam.  Neuro: AAOx4. No apparent cognitive deficits   Neurologic Exam:   Babinsky: flexor responses b/l.   Hoffmans: negative b/l Sensory exam: revealed normal sensation in all dermatomal regions in bilateral upper extremities and bilateral lower extremities; patchy non-dermatomal sensory deficits in bilateral LE.  Motor exam: strength 5/5 throughout bilateral upper extremities and bilateral lower extremities - give-way weakness with effort that resolves with sustained cueing.  Coordination: Fine motor coordination was normal.  No ataxia on FTN.  Gait: normal        Assessment & Plan:   Sandy Brennan is a 52 y.o. year old female  who  has a past medical history of Allergy (1973), Anxiety state (10/27/2022), Arthritis (2015), Connective tissue disorder ([place]), Disturbance of skin sensation (03/27/2013), GERD (gastroesophageal reflux disease) (2023), Graves disease, H/O radioactive iodine thyroid ablation, Hypertension (2005), Migraine without aura, without mention of intractable migraine without mention of status migrainosus, Neuromuscular disorder ([place]) (2014), and Vitamin B12 deficiency.   They are presenting to PM&R clinic as a new patient for treatment of chronic diffuse pain .    Chronic pain syndrome Assessment & Plan: Started with Hx HVLA to her neck 10 years prior; MRI w/ w/o brain and C spine 2017 showed small disc bulges at C5-C6 and T1-T2 without root impingement only. Now with crushing sensation in her stomach since 1 year prior, worsening.   Has failed gabapentin, lyrica, elavil, and cymbalta d/t s/e. Tramadol QHS with benefit but stopped d/t liver concerns with possibility of being a liver donor for her daughter.  Continue your current pain medication regimen.  If you do  not have a consistent regimen, I would recommend Voltaren gel to your neck and hips 4 times daily, along with intermittent Tylenol 1000 mg up to twice daily for adjunctive control.  This dose of Tylenol should be safe for your liver, however if you are concerned you can reduce it to 650 mg twice a day.    I will see back here in 6 to 8 weeks.  Call clinic if any questions, concerns.    Orders: -     Ambulatory referral to Physical Therapy  Bilateral lower extremity edema Assessment & Plan: Uncertain etiology of swelling, discoloration, and intermittent bullae at this time; has undergone significant workup. Discussed possible genetics referral if [facility] neurology workup unrevealing; on chart review, she has been offered this once for [facility], unsure if followed up.   Rheum with mildly elevated ANA only  Continue treatment with PCP  Orders: -     Ambulatory referral to Physical Therapy  Numbness Assessment & Plan: EMG last August in [facility]; no findings, was told likely small fiber neuropathy.   If you do get evaluated at [facility], please have them send the records here.  As discussed, if workup continues to be negative, we can discuss a referral to genetic counseling; however, I would finish your workup at [facility] first before pursuing this.    Orders: -     Ambulatory referral to Physical Therapy  Anxiety state Assessment & Plan: I recommend you seeking out a therapist to help with behavioral modification therapy to allow you to better handle the current stressors in your life.  I would call your insurance and ask for local resources that are covered, and choose a provider off of that list.    If you desire medication management for this issue, I  can refer you to a psychiatrist in our system.  However, I think in your case, it is extremely important for you to have access to a therapist regardless of medication management.   Primary insomnia Assessment & Plan: At next visit, if things or not  improving, we can discuss a low-dose narcotic such as oxycodone to use at nighttime to help with pain control that interrupts your sleep.    Decreased mobility Assessment & Plan: I have sent a referral to [facility] rehab specialist for aqua therapy.  This should help with pain control and increase your endurance, to hopefully work towards her goal of walking on the treadmill.  Orders: -     Ambulatory referral to Physical Therapy     Time spent on chart review (past images, labs, physician's notes), patient HPI, exam, development of treatment plan and patient education approximately 71 minutes on day of service.

## 2022-10-22 NOTE — Patient Instructions (Signed)
I have sent a referral to [facility] rehab specialist for aqua therapy.  This should help with pain control and increase your endurance, to hopefully work towards her goal of walking on the treadmill.  Continue your current pain medication regimen.  If you do not have a consistent regimen, I would recommend Voltaren gel to your neck and hips 4 times daily, along with intermittent Tylenol 1000 mg up to twice daily for adjunctive control.  This dose of Tylenol should be safe for your liver, however if you are concerned you can reduce it to 650 mg twice a day.  At next visit, if things or not improving, we can discuss a low-dose narcotic such as oxycodone to use at nighttime to help with pain control.  I recommend you sticking out a therapist to help with behavioral modification therapy to allow you to better handle the current stressors in your life.  I would call your insurance and ask for local resources that are covered, and choose a provider off of that list.  If you desire medication management for this issue, I can refer you to a psychiatrist in our system.  However, I think in your case, it is extremely important for you to have access to a therapist regardless of medication management.  I will see back here in 6 to 8 weeks.  Call clinic if any questions, concerns.  If you do get evaluated at [facility], please have them send the records here.  As discussed, if workup continues to be negative, we can discuss a referral to genetic counseling; however, I would finish your workup at [facility] first before pursuing this.

## 2022-10-25 ENCOUNTER — Encounter: Payer: Self-pay | Admitting: Physical Medicine and Rehabilitation

## 2022-10-27 ENCOUNTER — Encounter: Payer: Self-pay | Admitting: Physical Medicine and Rehabilitation

## 2022-10-27 DIAGNOSIS — M542 Cervicalgia: Secondary | ICD-10-CM | POA: Insufficient documentation

## 2022-10-27 DIAGNOSIS — R2689 Other abnormalities of gait and mobility: Secondary | ICD-10-CM | POA: Insufficient documentation

## 2022-10-27 DIAGNOSIS — F411 Generalized anxiety disorder: Secondary | ICD-10-CM | POA: Insufficient documentation

## 2022-10-27 HISTORY — DX: Generalized anxiety disorder: F41.1

## 2022-10-27 NOTE — Assessment & Plan Note (Addendum)
Started with Hx HVLA to her neck 10 years prior; MRI w/ w/o brain and C spine 2017 showed small disc bulges at C5-C6 and T1-T2 without root impingement only. Now with crushing sensation in her stomach since 1 year prior, worsening.   Has failed gabapentin, lyrica, elavil, and cymbalta d/t s/e. Tramadol QHS with benefit but stopped d/t liver concerns with possibility of being a liver donor for her daughter.  Continue your current pain medication regimen.  If you do not have a consistent regimen, I would recommend Voltaren gel to your neck and hips 4 times daily, along with intermittent Tylenol 1000 mg up to twice daily for adjunctive control.  This dose of Tylenol should be safe for your liver, however if you are concerned you can reduce it to 650 mg twice a day.    I will see back here in 6 to 8 weeks.  Call clinic if any questions, concerns.

## 2022-10-27 NOTE — Assessment & Plan Note (Signed)
I have sent a referral to [facility] rehab specialist for aqua therapy.  This should help with pain control and increase your endurance, to hopefully work towards her goal of walking on the treadmill.

## 2022-10-27 NOTE — Assessment & Plan Note (Signed)
I recommend you seeking out a therapist to help with behavioral modification therapy to allow you to better handle the current stressors in your life.  I would call your insurance and ask for local resources that are covered, and choose a provider off of that list.    If you desire medication management for this issue, I can refer you to a psychiatrist in our system.  However, I think in your case, it is extremely important for you to have access to a therapist regardless of medication management.

## 2022-10-27 NOTE — Assessment & Plan Note (Signed)
EMG last August in [facility]; no findings, was told likely small fiber neuropathy.   If you do get evaluated at [facility], please have them send the records here.  As discussed, if workup continues to be negative, we can discuss a referral to genetic counseling; however, I would finish your workup at [facility] first before pursuing this.

## 2022-10-27 NOTE — Assessment & Plan Note (Signed)
Uncertain etiology of swelling, discoloration, and intermittent bullae at this time; has undergone significant workup. Discussed possible genetics referral if [facility] neurology workup unrevealing; on chart review, she has been offered this once for [facility], unsure if followed up.   Rheum with mildly elevated ANA only  Continue treatment with PCP

## 2022-10-27 NOTE — Assessment & Plan Note (Signed)
At next visit, if things or not improving, we can discuss a low-dose narcotic such as oxycodone to use at nighttime to help with pain control that interrupts your sleep.

## 2022-11-02 ENCOUNTER — Other Ambulatory Visit: Payer: Self-pay | Admitting: Family Medicine

## 2022-11-02 ENCOUNTER — Encounter: Payer: Self-pay | Admitting: Family Medicine

## 2022-11-02 DIAGNOSIS — G629 Polyneuropathy, unspecified: Secondary | ICD-10-CM

## 2022-11-13 ENCOUNTER — Other Ambulatory Visit: Payer: Self-pay | Admitting: Family Medicine

## 2022-11-13 DIAGNOSIS — R209 Unspecified disturbances of skin sensation: Secondary | ICD-10-CM

## 2022-11-13 MED ORDER — PREGABALIN 25 MG PO CAPS: 25.0000 mg | ORAL_CAPSULE | Freq: Two times a day (BID) | ORAL | 0 refills | Status: DC

## 2022-12-01 ENCOUNTER — Other Ambulatory Visit: Payer: Self-pay | Admitting: Family Medicine

## 2022-12-03 ENCOUNTER — Encounter: Payer: PRIVATE HEALTH INSURANCE | Admitting: Physical Medicine and Rehabilitation

## 2022-12-03 NOTE — Telephone Encounter (Signed)
-   Last OV 08/2022 - Last filled 11/02/22

## 2022-12-04 ENCOUNTER — Encounter: Payer: Self-pay | Admitting: Family Medicine

## 2022-12-17 NOTE — Progress Notes (Signed)
 Assessment and Plan   1. DUB (dysfunctional uterine bleeding) (Primary) -Ultrasound in 2023 showed uterine fibroid.  Will order repeat ultrasound due to worsening DUB lasting longer with heavy clots.  Will check patient for anemia with CBC. -Follow-up with scheduled appointment with OB/GYN on 613 -     Comprehensive Metabolic Panel -     POCT CBC W/DIFF MEDONIC -     US  Pelvis Transabdominal Transvaginal Without Doppler  2. Acquired hypothyroidism -Currently taking Synthroid 112 mcg daily -     TSH  3.  Mixed hyperlipidemia -Continue Pravachol daily as directed -Work on low-fat, low-cholesterol diet with plenty of fiber. Attempt to improve diet and exercise patterns to aid in health maintenance.   Eat heart healthy diet with plenty of fruits and vegetables. Try to get 30 minutes of moderate exercise at least 5 days a week.  -     Lipid Panel  4. Urinary frequency -     POCT urinalysis dipstick showed positive nitrites, many bacteria, WBCs.  Will start patient on antibiotic therapy. -Start Macrobid 100 mg twice daily x 5 days. -Counseled on minimizing bladder irritants (coffee, caffeine, citrus, chocolate, and  spicy foods). Discussed drinking more water.   5.  Cystitis -See #4 -Urine culture pending  6.  Connective tissue disease --Continue current medication regimen and follow-ups as scheduled per  Rheumatology  Will contact patient regarding lab results and advise if any adjustments need to be made to plan of care. --Previous records and medical history reviewed  -Risks, benefits, and alternatives of the medications and treatment plan prescribed today were discussed, and patient expressed understanding. Plan follow-up as discussed or as needed if any worsening symptoms or change in condition.    Follow up in about 6 months (around 06/19/2023) for Chronic medical problems, Complicated.   Subjective   Chief Complaint    Patient presents with  . Menorrhagia  . Fatigue     HPI  Sandy Brennan presents today for concern for heavy menstrual cycles.  She states over the past few months menstrual cycles have been heavier and lasted longer.  And she is having blood clots.  She is going through more tampons and pads than usual.  She states yesterday she stood up and had heavy bleeding which soaked her clothes. She is followed by OB/GYN.  She has an upcoming appointment on 6/13.  She has a history of uterine fibroids. She states she is also having increased urination at night.  She is getting up to use the bathroom about 4 times a night.  She denies dysuria, back pain, nausea/vomiting, fever.  She states she has stopped drinking liquids after 6:00 that she is still having increased urination. Chart review shows she is also followed by [facility] family practice in [place].  PCP Patient's PCP is Eugene FORBES Molly, NP.    Health Maintenance Health Maintenance Due  Topic Date Due  . Zoster Vaccine (1 of 2) Never done  . COVID-19 Vaccine (4 - 2023-24 season) 03/30/2022  . Adult Wellness Exam  06/06/2022  . TSH Level  11/24/2022  . Lipid Panel  11/24/2022  . Mammogram  01/03/2023    Review of Systems ROS negative except as noted in HPI above  Smoking Status Social History   Tobacco Use  Smoking Status Former  . Packs/day: 0.25  . Years: 10.00  . Additional pack years: 0.00  . Total pack years: 2.50  . Types: Cigarettes  . Quit date: 90  . Years  since quitting: 30.4  Smokeless Tobacco Never    Allergies  Allergies  Allergen Reactions  . Erythromycin Swelling  . Codeine   . Penicillins   . Prednisone     Medications  Current Outpatient Medications:  .  BIOTIN PO, Take by mouth., Disp: , Rfl:  .  Bisacodyl (LAXATIVE PO), Take by mouth., Disp: , Rfl:  .  Calcium Polycarbophil (FIBERCON PO), Take by mouth., Disp: , Rfl:  .  eszopiclone  (LUNESTA ) 3 MG tablet, Take three mg by mouth at bedtime. Max Daily Amount: 3 mg, Disp: 30 tablet, Rfl: 3 .   Flaxseed, Linseed, (FLAX SEED OIL PO), Take by mouth., Disp: , Rfl:  .  fluticasone propionate (FLONASE) 50 mcg/actuation nasal spray, USE 1 SPRAY BY NASAL ROUTE DAILY, Disp: 16 mL, Rfl: 0 .  Green Tea, Camillia sinensis, (GREEN TEA PO), Take by mouth., Disp: , Rfl:  .  hydrOXYzine HCl (ATARAX) 10 mg tablet, TAKE 1 TABLET AT BEDTIME AS NEEDED ANXIETY, Disp: 90 tablet, Rfl: 1 .  levothyroxine sodium (SYNTHROID,LEVOTHROID,LEVOXYL) 112 mcg tablet, TAKE 1 TABLET BY MOUTH EVERY DAY, Disp: 90 tablet, Rfl: 0 .  lidocaine (XYLOCAINE) 5 % ointment, SMARTSIG:Topical 3 Times Daily PRN, Disp: , Rfl:  .  lubiprostone (AMITIZA) 24 mcg capsule, SMARTSIG:Capsule(s) By Mouth, Disp: , Rfl:  .  Magnesium 500 MG CAPS, Take by mouth., Disp: , Rfl:  .  nitrofurantoin, macrocrystal-monohydrate, (MACROBID) 100 mg capsule, Take one capsule (100 mg dose) by mouth 2 (two) times daily for 5 days., Disp: 10 capsule, Rfl: 0 .  ondansetron (ZOFRAN) 4 mg tablet, Take one tablet (4 mg dose) by mouth every 8 (eight) hours as needed for Nausea., Disp: 30 tablet, Rfl: 0 .  pantoprazole sodium (PROTONIX) 40 mg tablet, Take one tablet (40 mg dose) by mouth 30 (thirty) minutes before breakfast and at bedtime., Disp: 180 tablet, Rfl: 3 .  Potassium (POTASSIMIN PO), Take by mouth., Disp: , Rfl:  .  pravastatin sodium (PRAVACHOL) 20 mg tablet, TAKE ONE TABLET BY MOUTH EVERY EVENING., Disp: 90 tablet, Rfl: 1 .  pregabalin  (LYRICA ) 25 mg capsule, Take one capsule (25 mg dose) by mouth., Disp: , Rfl:  .  THERA (THEREMS,ONE-DAILY) TABS, Take one tablet by mouth daily., Disp: , Rfl:  .  triamcinolone acetonide (KENALOG) 0.1% cream, Apply topically as needed., Disp: , Rfl:  .  triamterene-hydrochlorothiazide (MAXZIDE) 75-50 mg per tablet, TAKE 1 TABLET BY MOUTH EVERY DAY, Disp: 90 tablet, Rfl: 1  Problem List Patient Active Problem List  Diagnosis  . Overweight (BMI 25.0-29.9)  . Positive FIT (fecal immunochemical test)  . Family history  of colon cancer  . Family history of hemochromatosis  . Acquired hypothyroidism  . Impaired fasting glucose  . Insomnia  . Edema of extremities  . Monoallelic mutation of HFE gene     Reviewed and updated this visit by provider: Tobacco  Allergies  Meds  Problems  Med Hx  Surg Hx  Fam Hx  PDMP        Objective   Vitals:   12/17/22 1029  BP: 122/70  Patient Position: Sitting  Pulse: 83  Temp: 97.2 F (36.2 C)  TempSrc: Temporal  Resp: 16  Height: 5' 5 (1.651 m)  Weight: 149 lb 9.6 oz (67.9 kg)  SpO2: 100%  BMI (Calculated): 24.9  PainSc:   4    Physical Exam Vitals and nursing note reviewed.  Constitutional:      General: She is not in acute distress.  Appearance: Normal appearance. She is not ill-appearing.  HENT:     Mouth/Throat:     Mouth: Mucous membranes are moist.     Pharynx: Oropharynx is clear.  Cardiovascular:     Rate and Rhythm: Normal rate and regular rhythm.     Pulses: Normal pulses.     Heart sounds: Normal heart sounds. No murmur heard.    No friction rub. No gallop.  Musculoskeletal:     Cervical back: Normal range of motion.     Right lower leg: No edema.     Left lower leg: No edema.  Pulmonary:     Effort: Pulmonary effort is normal. No respiratory distress.     Breath sounds: Normal breath sounds. No wheezing, rhonchi or rales.  Abdominal:     General: Bowel sounds are normal. There is no distension.     Palpations: Abdomen is soft. There is no mass.     Tenderness: There is abdominal tenderness (Mid groin tenderness with deep palpation). There is no guarding or rebound.     Hernia: No hernia is present.  Skin:    Findings: No erythema or rash.  Neurological:     Mental Status: She is alert.  Psychiatric:        Mood and Affect: Mood normal.

## 2023-01-14 ENCOUNTER — Encounter: Payer: PRIVATE HEALTH INSURANCE | Admitting: Physical Medicine and Rehabilitation

## 2023-02-25 NOTE — Progress Notes (Signed)
 AVS online. Labs printed for patient. SUPREP prep given to and reviewed with patient.

## 2023-03-25 NOTE — Telephone Encounter (Signed)
 LVM for pt to call our office back in regards to the 8/30 appt that had to be rescheduled due to providers not being in clinic.   If pt calls back please let them know that they have been rescheduled with Dr. Victorino at [facility] clinic for 8/29 at 9:00am as this is the soonest that any provider has within the next 2 months.

## 2023-04-03 ENCOUNTER — Other Ambulatory Visit: Payer: Self-pay | Admitting: Family Medicine

## 2023-04-04 ENCOUNTER — Encounter: Payer: Self-pay | Admitting: Family Medicine

## 2023-04-05 ENCOUNTER — Telehealth (INDEPENDENT_AMBULATORY_CARE_PROVIDER_SITE_OTHER): Payer: PRIVATE HEALTH INSURANCE | Admitting: Family Medicine

## 2023-04-05 ENCOUNTER — Encounter: Payer: Self-pay | Admitting: Family Medicine

## 2023-04-05 VITALS — Ht 65.0 in

## 2023-04-05 DIAGNOSIS — G894 Chronic pain syndrome: Secondary | ICD-10-CM

## 2023-04-05 DIAGNOSIS — G4700 Insomnia, unspecified: Secondary | ICD-10-CM | POA: Diagnosis not present

## 2023-04-05 MED ORDER — ESZOPICLONE 3 MG PO TABS
3.0000 mg | ORAL_TABLET | Freq: Every day | ORAL | 4 refills | Status: AC
Start: 2023-04-05 — End: ?

## 2023-04-05 MED ORDER — TRAMADOL HCL 50 MG PO TABS
25.0000 mg | ORAL_TABLET | Freq: Every evening | ORAL | 0 refills | Status: DC | PRN
Start: 2023-04-05 — End: 2023-07-02

## 2023-04-05 NOTE — Progress Notes (Unsigned)
Virtual Visit via Video Note I connected with Sandy Brennan on 04/06/23 by a video enabled telemedicine application and verified that I am speaking with the correct person using two identifiers. Location patient: home Location provider:work office Persons participating in the virtual visit: patient, provider  I discussed the limitations of evaluation and management by telemedicine and the availability of in person appointments. The patient expressed understanding and agreed to proceed.  Chief Complaint  Patient presents with   Medication Refill    HPI: Ms. Sandy Brennan is past medical history significant for chronic abdominal pain,insomnia,  hypothyroidism,GERD,IBS, and chronic pain synd being seen today to follow on insomnia. She was last seen on 09/05/22.  Since her last visit she has follow-up with rheumatologist, gastroenterologist, urologist, and gynecologist.  Still experiencing difficulty sleeping, only achieving about 2 to 2.5 hours of sleep per night with the use of Eszopiclone 3 mg at bedtime. She experiences agitation when lying down if she does not take medication.  She has tried Trazodone, Seroquel,Valium,melatonin,Mg,CBD, Ambien, and hydroxyzine but these medications did not help at all.  Pain is affecting her sleep: Generalized arthralgias,myalgias, and numbness. She denies any side effects from the Eszopiclone, has been on this medication for years. + Fatigue. No changes in bowel habits,vomiting,dysuria,gross hematuria, focal deficit,or hallucinations. Negative for fever. Last visit I recommended trying Tramadol for pain but stopped taking it due to concerns about potential liver issues, as she is a potential liver transplant match for her daughter. States that her daughter has all same symptoms she does but also has liver fibrosis.  Lyrica did not help at all. She has been on Gabapentin,Lyrica, Nortriptyline,and Duloxetine in the past but did not tolerate well.   ROS: See  pertinent positives and negatives per HPI.  Past Medical History:  Diagnosis Date   Allergy 1973   Anxiety state 10/27/2022   Arthritis 2015   Connective tissue disorder (HCC)    Disturbance of skin sensation 03/27/2013   GERD (gastroesophageal reflux disease) 2023   Graves disease    Currently hypothyroid   H/O radioactive iodine thyroid ablation    Hypertension 2005   Migraine without aura, without mention of intractable migraine without mention of status migrainosus    Neuromuscular disorder (HCC) 2014   Vitamin B12 deficiency     Past Surgical History:  Procedure Laterality Date   CESAREAN SECTION  1996, 2000   2 surgeries   TONSILLECTOMY  1977   TUBAL LIGATION Bilateral 2000    Family History  Problem Relation Age of Onset   Cancer Mother        Breast cancer   AAA (abdominal aortic aneurysm) Mother    Arthritis Mother    Colon cancer Father    Cancer Father    Early death Father    Healthy Sister    Diabetes Maternal Grandmother    Ovarian cancer Maternal Grandmother    Lung cancer Paternal Grandmother    Lung cancer Paternal Grandfather    Intellectual disability Son     Social History   Socioeconomic History   Marital status: Married    Spouse name: Not on file   Number of children: 2   Years of education: 2 yrs college   Highest education level: Associate degree: academic program  Occupational History   Occupation: Self-employed  Tobacco Use   Smoking status: Former    Current packs/day: 0.00    Types: Cigarettes    Quit date: 04/18/1994    Years since quitting: 28.9  Passive exposure: Past   Smokeless tobacco: Never  Vaping Use   Vaping status: Never Used  Substance and Sexual Activity   Alcohol use: Yes    Alcohol/week: 5.0 standard drinks of alcohol    Types: 5 Glasses of wine per week    Comment: 1 glass per day   Drug use: No   Sexual activity: Yes    Partners: Male    Birth control/protection: Surgical    Comment: Married  Other  Topics Concern   Not on file  Social History Narrative   Lives at home w/ her husband and 2 children   Right-handed   Caffeine: 2 cups per day   Social Determinants of Health   Financial Resource Strain: Low Risk  (12/16/2022)   Received from [facility]   Overall Financial Resource Strain (CARDIA)    Difficulty of Paying Living Expenses: Not very hard  Food Insecurity: No Food Insecurity (12/17/2022)   [facility]   Hunger Vital Sign    Worried About Running Out of Food in the Last Year: Never true    Ran Out of Food in the Last Year: Never true  Transportation Needs: No Transportation Needs (12/17/2022)   Received from [facility] - Transportation    Lack of Transportation (Medical): No    Lack of Transportation (Non-Medical): No  Physical Activity: Insufficiently Active (06/25/2022)   Exercise Vital Sign    Days of Exercise per Week: 3 days    Minutes of Exercise per Session: 10 min  Stress: Stress Concern Present (06/25/2022)   [employer] [facility] [employer] - Occupational Stress Questionnaire    Feeling of Stress : Rather much  Social Connections: Moderately Integrated (12/16/2022)   Received from [facility]   Social Network    How would you rate your social network (family, work, friends)?: Adequate participation with social networks  Intimate Partner Violence: Not At Risk (12/16/2022)   Received from [facility]   HITS    Over the last 12 months how often did your partner physically hurt you?: 1    Over the last 12 months how often did your partner insult you or talk down to you?: 1    Over the last 12 months how often did your partner threaten you with physical harm?: 1    Over the last 12 months how often did your partner scream or curse at you?: 1     Current Outpatient Medications:    fluticasone (FLONASE) 50 MCG/ACT nasal spray, Place 1 spray into both nostrils daily., Disp: , Rfl:    hydrOXYzine (ATARAX) 10 MG tablet,  Take 10 mg by mouth once. prn, Disp: , Rfl:    levothyroxine (SYNTHROID) 112 MCG tablet, Take 112 mcg by mouth daily., Disp: , Rfl:    ondansetron (ZOFRAN) 4 MG tablet, Take 4 mg by mouth every 8 (eight) hours as needed for nausea or vomiting., Disp: , Rfl:    Potassium 99 MG TABS, Take 99 mg by mouth daily., Disp: , Rfl:    traMADol (ULTRAM) 50 MG tablet, Take 0.5-1 tablets (25-50 mg total) by mouth at bedtime as needed., Disp: 15 tablet, Rfl: 0   triamterene-hydrochlorothiazide (MAXZIDE) 75-50 MG tablet, Take 1 tablet by mouth daily., Disp: , Rfl:    Eszopiclone 3 MG TABS, Take 1 tablet (3 mg total) by mouth at bedtime., Disp: 30 tablet, Rfl: 4 No current facility-administered medications for this visit.  Facility-Administered Medications Ordered in Other Visits:  gadopentetate dimeglumine (MAGNEVIST) injection 13 mL, 13 mL, Intravenous, Once PRN, Anne Hahn, Hennie Duos, MD  EXAMTheodoro Kos per patient if applicable:Ht 5\' 5"  (1.651 m)   BMI 25.36 kg/m   GENERAL: alert, oriented, appears well and in no acute distress  HEENT: atraumatic, conjunctiva clear, no obvious abnormalities on inspection.  NECK: normal movements of the head and neck  LUNGS: on inspection no signs of respiratory distress, breathing rate appears normal, no obvious gross SOB, gasping or wheezing  CV: no obvious cyanosis  MS: moves all visible extremities without noticeable abnormality  PSYCH/NEURO: pleasant and cooperative, no obvious depression or anxiety, speech and thought processing grossly intact  ASSESSMENT AND PLAN:  Discussed the following assessment and plan:  Insomnia, unspecified type Assessment & Plan: Problem is not well controlled but stable. Has tried other medications in the past and reports that Alfonso Patten has been the only one that has helped her to get a few hours of sleep. Continue Lunesta 3 mg at bedtime and good sleep hygiene. F/U in 6 months, before if needed.  Orders: -     Eszopiclone;  Take 1 tablet (3 mg total) by mouth at bedtime.  Dispense: 30 tablet; Refill: 4  Chronic pain syndrome Assessment & Plan: This is affecting her sleep. She would like to try Tramadol again, 50 mg at bedtime. She will let me know if she finds medication helpful for pain controlled at night. We reviewed side effects.  Orders: -     traMADol HCl; Take 0.5-1 tablets (25-50 mg total) by mouth at bedtime as needed.  Dispense: 15 tablet; Refill: 0   We discussed possible serious and likely etiologies, options for evaluation and workup, limitations of telemedicine visit vs in person visit, treatment, treatment risks and precautions. The patient was advised to call back or seek an in-person evaluation if the symptoms worsen or if the condition fails to improve as anticipated. I discussed the assessment and treatment plan with the patient. The patient was provided an opportunity to ask questions and all were answered. The patient agreed with the plan and demonstrated an understanding of the instructions.  Return in about 6 months (around 10/03/2023) for chronic problems.  Rakan Soffer G. [place], MD  [facility]. [place] office.

## 2023-04-06 NOTE — Assessment & Plan Note (Addendum)
This is affecting her sleep. She would like to try Tramadol again, 50 mg at bedtime. She will let me know if she finds medication helpful for pain controlled at night. We reviewed side effects.

## 2023-04-06 NOTE — Assessment & Plan Note (Signed)
Problem is not well controlled but stable. Has tried other medications in the past and reports that Alfonso Patten has been the only one that has helped her to get a few hours of sleep. Continue Lunesta 3 mg at bedtime and good sleep hygiene. F/U in 6 months, before if needed.

## 2023-04-16 ENCOUNTER — Encounter: Payer: Self-pay | Admitting: Family Medicine

## 2023-05-30 ENCOUNTER — Other Ambulatory Visit: Payer: Self-pay | Admitting: Medical Genetics

## 2023-05-30 DIAGNOSIS — Z006 Encounter for examination for normal comparison and control in clinical research program: Secondary | ICD-10-CM

## 2023-05-31 ENCOUNTER — Encounter: Payer: Self-pay | Admitting: Family Medicine

## 2023-06-05 ENCOUNTER — Other Ambulatory Visit: Payer: Self-pay | Admitting: Family Medicine

## 2023-06-05 DIAGNOSIS — R600 Localized edema: Secondary | ICD-10-CM

## 2023-06-13 ENCOUNTER — Other Ambulatory Visit (HOSPITAL_COMMUNITY): Payer: Self-pay

## 2023-06-30 ENCOUNTER — Other Ambulatory Visit: Payer: Self-pay | Admitting: Family Medicine

## 2023-06-30 DIAGNOSIS — G894 Chronic pain syndrome: Secondary | ICD-10-CM

## 2023-07-16 ENCOUNTER — Other Ambulatory Visit (HOSPITAL_COMMUNITY)
Admission: RE | Admit: 2023-07-16 | Discharge: 2023-07-16 | Disposition: A | Discharge status: Home / Self Care | Payer: PRIVATE HEALTH INSURANCE | Source: Ambulatory Visit | Attending: Medical Genetics | Admitting: Medical Genetics

## 2023-07-16 DIAGNOSIS — Z006 Encounter for examination for normal comparison and control in clinical research program: Secondary | ICD-10-CM | POA: Insufficient documentation

## 2023-07-23 LAB — GENECONNECT MOLECULAR SCREEN: Genetic Analysis Overall Interpretation: NEGATIVE
